# Patient Record
Sex: Male | Born: 1960 | Race: White | Hispanic: No | Marital: Married | Smoking: Former smoker
Health system: Southern US, Community
[De-identification: ages and names within clinical notes are randomized; demographics above are authoritative.]

## PROBLEM LIST (undated history)

## (undated) DIAGNOSIS — G4733 Obstructive sleep apnea (adult) (pediatric): Secondary | ICD-10-CM

## (undated) DIAGNOSIS — E669 Obesity, unspecified: Secondary | ICD-10-CM

## (undated) DIAGNOSIS — F319 Bipolar disorder, unspecified: Secondary | ICD-10-CM

## (undated) DIAGNOSIS — T7840XA Allergy, unspecified, initial encounter: Secondary | ICD-10-CM

## (undated) DIAGNOSIS — I2510 Atherosclerotic heart disease of native coronary artery without angina pectoris: Secondary | ICD-10-CM

## (undated) HISTORY — DX: Atherosclerotic heart disease of native coronary artery without angina pectoris: I25.10

## (undated) HISTORY — DX: Allergy, unspecified, initial encounter: T78.40XA

## (undated) HISTORY — DX: Obesity, unspecified: E66.9

## (undated) HISTORY — DX: Obstructive sleep apnea (adult) (pediatric): G47.33

## (undated) HISTORY — DX: Bipolar disorder, unspecified: F31.9

---

## 2013-01-11 ENCOUNTER — Encounter: Payer: Self-pay | Admitting: *Deleted

## 2013-01-11 ENCOUNTER — Encounter: Payer: Self-pay | Admitting: Cardiology

## 2013-01-11 DIAGNOSIS — G4733 Obstructive sleep apnea (adult) (pediatric): Secondary | ICD-10-CM | POA: Insufficient documentation

## 2013-01-11 DIAGNOSIS — F319 Bipolar disorder, unspecified: Secondary | ICD-10-CM | POA: Insufficient documentation

## 2013-01-11 DIAGNOSIS — E669 Obesity, unspecified: Secondary | ICD-10-CM | POA: Insufficient documentation

## 2013-01-16 ENCOUNTER — Encounter: Payer: Self-pay | Admitting: Cardiology

## 2013-01-20 ENCOUNTER — Ambulatory Visit (INDEPENDENT_AMBULATORY_CARE_PROVIDER_SITE_OTHER): Payer: 59 | Admitting: Cardiology

## 2013-01-20 ENCOUNTER — Encounter: Payer: Self-pay | Admitting: Cardiology

## 2013-01-20 VITALS — BP 150/80 | HR 80 | Ht 69.0 in | Wt 282.0 lb

## 2013-01-20 DIAGNOSIS — G4733 Obstructive sleep apnea (adult) (pediatric): Secondary | ICD-10-CM

## 2013-01-20 DIAGNOSIS — E669 Obesity, unspecified: Secondary | ICD-10-CM

## 2013-01-20 NOTE — Progress Notes (Signed)
  [address] [place], [place]  [address] Phone: [phone] Fax:  [phone]  Date:  01/20/2013   ID:  Luke [name], DOB 22-May-1960, MRN [id number]  PCP:  No primary provider on file.  Sleep MD:  Armanda Magic, MD     HPI:  THe patient presents today for followup of his OSA and obesity.  He is doing well.  He tolerates the nasal pillow mask well with no chin strap.  He feels the pressure is adequate.  He feels rested in the am and has no daytime sleepiness.  He walks 2 miles every day.     Past Medical History  Diagnosis Date  . Bipolar disorder   . OSA (obstructive sleep apnea)     mild OSA with AHI 15.2/hr on CPAP at 12cm H2O - Dr. Mayford Knife  . Obesity     Current Outpatient Prescriptions  Medication Sig Dispense Refill  . Cholecalciferol (VITAMIN D-3) 1000 UNITS CAPS Take by mouth.      . fenofibrate 160 MG tablet Take 160 mg by mouth daily.      Marland Kitchen lamoTRIgine (LAMICTAL) 150 MG tablet 2 tabs po qd      . Multiple Vitamin (MULTIVITAMIN) capsule Take 1 capsule by mouth daily.      . Omega-3 Fatty Acids (FISH OIL PO) Take 1 tablet by mouth daily.       No current facility-administered medications for this visit.    Allergies:    Allergies  Allergen Reactions  . Penicillins     Social History:  The patient  reports that he quit smoking about 8 years ago. His smoking use included Cigarettes. He smoked 0.00 packs per day. He does not have any smokeless tobacco history on file.   Family History:  The patient's family history includes CVA in his father and mother; Diabetes Mellitus II in his sister.   ROS:  Please see the history of present illness.      All other systems reviewed and negative.   PHYSICAL EXAM: VS:  BP 150/80  Pulse 80  Ht 5\' 9"  (1.753 m)  Wt 282 lb (127.914 kg)  BMI 41.63 kg/m2 Well nourished, well developed, in no acute distress HEENT: normal Neck: no JVD Cardiac:  normal S1, S2; RRR; no murmur Lungs:  clear to auscultation bilaterally,  no wheezing, rhonchi or rales Abd: soft, nontender, no hepatomegaly Ext: no edema Skin: warm and dry Neuro:  CNs 2-12 intact, no focal abnormalities noted      ASSESSMENT AND PLAN:  1. Obstructive sleep apnea - well controlled with no daytime sleepiness.  I will get a download from his DME. 2. Obesity - I have encouraged him to increase his walking to 1 hour of brisk walking daily.  We discussed portion control with his diet as well.  Followup with me in 6 months  Signed, Armanda Magic, MD 01/20/2013 9:11 AM

## 2013-01-20 NOTE — Patient Instructions (Signed)
Your physician recommends that you continue on your current medications as directed. Please refer to the Current Medication list given to you today.  Your physician recommends that you schedule a follow-up appointment in: 6 MONTHS with Dr. Mayford Knife

## 2013-02-11 NOTE — Progress Notes (Signed)
To Danielle.  

## 2013-02-11 NOTE — Progress Notes (Signed)
Pt is aware of the results

## 2013-02-19 ENCOUNTER — Ambulatory Visit: Payer: 59 | Admitting: Emergency Medicine

## 2013-02-19 VITALS — BP 138/60 | HR 86 | Temp 97.9°F | Resp 18 | Ht 70.5 in | Wt 281.4 lb

## 2013-02-19 DIAGNOSIS — Z0000 Encounter for general adult medical examination without abnormal findings: Secondary | ICD-10-CM

## 2013-02-19 DIAGNOSIS — Z0289 Encounter for other administrative examinations: Secondary | ICD-10-CM

## 2013-02-19 NOTE — Progress Notes (Signed)
[facility] [address], [place] [address] [phone]  Date:  02/19/2013   Name:  Luke Luke [name]   DOB:  1961-01-25   MRN:  [id number]  PCP:  No primary provider on file.    Chief Complaint: Annual Exam   History of Present Illness:  Luke Luke [name] is a 52 y.o. very pleasant male patient who presents with the following:  For DOT certification   Patient Active Problem List   Diagnosis Date Noted  . Bipolar disorder   . OSA (obstructive sleep apnea)   . Obesity     Past Medical History  Diagnosis Date  . Bipolar disorder   . OSA (obstructive sleep apnea)     mild OSA with AHI 15.2/hr on CPAP at 12cm H2O - Dr. Mayford Knife  . Obesity   . Allergy     No past surgical history on file.  History  Substance Use Topics  . Smoking status: Former Smoker    Types: Cigarettes    Quit date: 12/16/2004  . Smokeless tobacco: Not on file  . Alcohol Use: Not on file    Family History  Problem Relation Age of Onset  . CVA Mother   . CVA Father   . Diabetes Mellitus Luke [name] Sister     Allergies  Allergen Reactions  . Penicillins     Medication list has been reviewed and updated.  Current Outpatient Prescriptions on File Prior to Visit  Medication Sig Dispense Refill  . Cholecalciferol (VITAMIN D-3) 1000 UNITS CAPS Take by mouth.      . fenofibrate 160 MG tablet Take 160 mg by mouth daily.      Marland Kitchen lamoTRIgine (LAMICTAL) 150 MG tablet 2 tabs po qd      . Multiple Vitamin (MULTIVITAMIN) capsule Take 1 capsule by mouth daily.      . Omega-3 Fatty Acids (FISH OIL PO) Take 1 tablet by mouth daily.       No current facility-administered medications on file prior to visit.    Review of Systems:  As per HPI, otherwise negative.    Physical Examination: Filed Vitals:   02/19/13 1423  BP: 156/98  Pulse: 86  Temp: 97.9 F (36.6 C)  Resp: 18   Filed Vitals:   02/19/13 1423  Height: 5' 10.5" (1.791 m)  Weight: 281 lb 6.4 oz (127.642 kg)    Body mass index is 39.79 kg/(m^2). Ideal Body Weight: Weight in (lb) to have BMI = 25: 176.4  GEN: WDWN, NAD, Non-toxic, A & O x 3 HEENT: Atraumatic, Normocephalic. Neck supple. No masses, No LAD. Ears and Nose: No external deformity. CV: RRR, No M/G/R. No JVD. No thrill. No extra heart sounds. PULM: CTA B, no wheezes, crackles, rhonchi. No retractions. No resp. distress. No accessory muscle use. ABD: S, NT, ND, +BS. No rebound. No HSM. EXTR: No c/c/e NEURO Normal gait.  PSYCH: Normally interactive. Conversant. Not depressed or anxious appearing.  Calm demeanor.    Assessment and Plan: DOT certification   Blood pressure good Need OSA / CPAP record   Signed,  Phillips Odor, MD

## 2013-02-21 ENCOUNTER — Telehealth: Payer: Self-pay | Admitting: Cardiology

## 2013-02-21 NOTE — Telephone Encounter (Signed)
New Problem:  Pt states he needs a letter stating his sleep study results are ok and that he is using his c-pap machine appropriately.

## 2013-02-21 NOTE — Telephone Encounter (Signed)
To Dr. Mayford Knife to ok me sending pt this letter stating his Sleep Study results and that he is doing well on the CPAP machine

## 2013-02-23 NOTE — Telephone Encounter (Signed)
Patient is doing well and is very compliant with his CPAP device

## 2013-02-24 ENCOUNTER — Encounter: Payer: Self-pay | Admitting: General Surgery

## 2013-02-24 NOTE — Telephone Encounter (Signed)
Letter wrote for pt and signed by Dr. Mayford Knife. Put up front for pt to pick up./

## 2013-04-03 ENCOUNTER — Ambulatory Visit (INDEPENDENT_AMBULATORY_CARE_PROVIDER_SITE_OTHER): Payer: 59 | Admitting: Family Medicine

## 2013-04-03 VITALS — BP 142/80 | HR 91 | Temp 98.1°F | Resp 18 | Ht 70.0 in | Wt 274.0 lb

## 2013-04-03 DIAGNOSIS — T50905A Adverse effect of unspecified drugs, medicaments and biological substances, initial encounter: Secondary | ICD-10-CM

## 2013-04-03 DIAGNOSIS — T887XXA Unspecified adverse effect of drug or medicament, initial encounter: Secondary | ICD-10-CM

## 2013-04-03 DIAGNOSIS — L500 Allergic urticaria: Secondary | ICD-10-CM

## 2013-04-03 MED ORDER — RANITIDINE HCL 150 MG PO TABS: 150.0000 mg | ORAL_TABLET | Freq: Two times a day (BID) | ORAL | Status: DC

## 2013-04-03 MED ORDER — PREDNISONE 20 MG PO TABS: 40.0000 mg | ORAL_TABLET | Freq: Every day | ORAL | Status: DC

## 2013-04-03 MED ORDER — LAMOTRIGINE 150 MG PO TABS: 300.0000 mg | ORAL_TABLET | Freq: Every day | ORAL | Status: AC

## 2013-04-03 MED ORDER — CETIRIZINE HCL 10 MG PO TABS: 10.0000 mg | ORAL_TABLET | Freq: Every day | ORAL | Status: DC

## 2013-04-03 NOTE — Patient Instructions (Signed)

## 2013-04-03 NOTE — Progress Notes (Signed)
Subjective:    Patient ID: Luke [name], male    DOB: 04/23/1960, 52 y.o.   MRN: [id number] This chart was scribed for Norberto Sorenson, MD by Danella Maiers, ED Scribe. This patient was seen in room 3 and the patient's care was started at 9:16 AM.  Chief Complaint  Patient presents with  . Urticaria    arms -started monday     HPI HPI Comments: Luke [name] is a 53 y.o. male who presents to the [facility] complaining of itchy rash over entire body but worst to bilateral arms onset 2 days ago that started around his belt line immed after a change in the manufacturer of his Lamictal. Yesterday it spread to the back of both legs and buttocks. This morning it spread to his sides and just one hour ago spread to bilateral arms. He states 2 days ago he started taking his Lamicatal from a different manufacturer - [employer] - because of changes in insurance which required his med be supplied through a mail-order pharmacy rather than [facility] as usual. He was on the generic from [facility] as well but definitely not manufactured by [employer] - he checked as well as the pill looks different. He has been taking 1-2 benadryl every 4 hours to help w/ the itching but only works minimally - still very very itchy. He denies new clothes, jewelry, food. Has not been eating any sig source of fruit, no hiking or outdoor work recently. No change in soaps, lotions, detergent, etc. No change in other meds, no new or different vitamins/herbals/supplements. His mood is very stable - no prob at all w/ it. He has been on generic lamictal through [facility] for yrs w/o issue.  PCP Lupe Carney, MD  Past Medical History  Diagnosis Date  . Bipolar disorder   . OSA (obstructive sleep apnea)     mild OSA with AHI 15.2/hr on CPAP at 12cm H2O - Dr. Mayford Knife  . Obesity   . Allergy     Current Outpatient Prescriptions on File Prior to Visit  Medication Sig Dispense Refill  . Cholecalciferol (VITAMIN D-3) 1000 UNITS CAPS  Take by mouth.      . fenofibrate 160 MG tablet Take 160 mg by mouth daily.      Marland Kitchen lamoTRIgine (LAMICTAL) 150 MG tablet 2 tabs po qd      . Multiple Vitamin (MULTIVITAMIN) capsule Take 1 capsule by mouth daily.      . Omega-3 Fatty Acids (FISH OIL PO) Take 1 tablet by mouth daily.       No current facility-administered medications on file prior to visit.   Allergies  Allergen Reactions  . Penicillins     Review of Systems  Constitutional: Negative for fever, chills, activity change and appetite change.  HENT: Negative for trouble swallowing.   Respiratory: Negative for cough, chest tightness, shortness of breath and wheezing.   Cardiovascular: Negative for chest pain, palpitations and leg swelling.  Gastrointestinal: Negative for nausea, vomiting, abdominal pain, diarrhea and constipation.  Musculoskeletal: Negative for gait problem, joint swelling and myalgias.  Skin: Positive for rash. Negative for wound.  Neurological: Negative for weakness and numbness.  Hematological: Negative for adenopathy. Does not bruise/bleed easily.  Psychiatric/Behavioral: Positive for sleep disturbance. Negative for behavioral problems, dysphoric mood and agitation. The patient is not nervous/anxious.       BP 142/80  Pulse 91  Temp(Src) 98.1 F (36.7 C) (Oral)  Resp 18  Ht 5'  10" (1.778 m)  Wt 274 lb (124.286 kg)  BMI 39.32 kg/m2  SpO2 96% Objective:   Physical Exam  Nursing note and vitals reviewed. Constitutional: He is oriented to person, place, and time. He appears well-developed and well-nourished. No distress.  HENT:  Head: Normocephalic and atraumatic.  Eyes: EOM are normal.  Neck: Neck supple. No tracheal deviation present.  Cardiovascular: Normal rate and regular rhythm.   Pulmonary/Chest: Effort normal and breath sounds normal. No respiratory distress. He has no wheezes.  Musculoskeletal: Normal range of motion.  Neurological: He is alert and oriented to person, place, and time.    Skin: Skin is warm and dry. Rash noted. Rash is urticarial.  Urticaria over proximal arms, prox legs, chest, abd, back -  worst over bilateral inner forearm. Also moderately present over entire ant and post trunk - worst laterally.  Psychiatric: He has a normal mood and affect. His behavior is normal.          Assessment & Plan:  Allergic urticaria - I think pt's theory that he is having an allergic rash to an inactive ingredient in his new generic lamictal seems likely. Certainly  lamictal can cause some horrible rashes (TEN, Steven-Johnson, etc) but this seems highly unlikely due to blatant urticarial nature of rash (transient appearance and location) and the fact that pt has been on a stable dose for a long time and he is otherwise asymptomatic. He cannot think any anything else that is new or different in his life that could be causing an allergy.  Will try switching the lamictal back to either the generic he was on prior or brand name. In short term, try prednisone 40 qd x 3d to help w/ severe itching - warned of mood side effects but pt not concerned - mood very stable. Start zyrtec/zantac qd x 2 wks. RTC if continues or worsens.  Medication reaction, initial encounter  Meds ordered this encounter  Medications  . lamoTRIgine (LAMICTAL) 150 MG tablet    Sig: Take 2 tablets (300 mg total) by mouth daily. Please only use generic from manufacturer pt has been on prior OR brand name. Do NOT USE GENERIC FROM [employer] - MEDICALLY NECESSARY    Dispense:  60 tablet    Refill:  1    Please only use generic from manufacturer pt has been on prior OR brand name. Do NOT USE GENERIC FROM [employer] - MEDICALLY NECESSARY  . predniSONE (DELTASONE) 20 MG tablet    Sig: Take 2 tablets (40 mg total) by mouth daily with breakfast.    Dispense:  6 tablet    Refill:  0  . cetirizine (ZYRTEC) 10 MG tablet    Sig: Take 1 tablet (10 mg total) by mouth daily.    Dispense:  30 tablet    Refill:  1  . ranitidine  (ZANTAC) 150 MG tablet    Sig: Take 1 tablet (150 mg total) by mouth 2 (two) times daily.    Dispense:  60 tablet    Refill:  0    I personally performed the services described in this documentation, which was scribed in my presence. The recorded information has been reviewed and considered, and addended by me as needed.  Norberto Sorenson, MD MPH

## 2013-04-07 ENCOUNTER — Ambulatory Visit (INDEPENDENT_AMBULATORY_CARE_PROVIDER_SITE_OTHER): Payer: 59 | Admitting: Emergency Medicine

## 2013-04-07 VITALS — BP 144/74 | HR 100 | Temp 100.4°F | Resp 18 | Ht 70.5 in | Wt 276.0 lb

## 2013-04-07 DIAGNOSIS — R509 Fever, unspecified: Secondary | ICD-10-CM

## 2013-04-07 DIAGNOSIS — J111 Influenza due to unidentified influenza virus with other respiratory manifestations: Secondary | ICD-10-CM

## 2013-04-07 LAB — POCT INFLUENZA A/B: Influenza A, POC: POSITIVE

## 2013-04-07 MED ORDER — PROMETHAZINE-CODEINE 6.25-10 MG/5ML PO SYRP: 5.0000 mL | ORAL_SOLUTION | ORAL | Status: DC | PRN

## 2013-04-07 MED ORDER — OSELTAMIVIR PHOSPHATE 75 MG PO CAPS: 75.0000 mg | ORAL_CAPSULE | Freq: Two times a day (BID) | ORAL | Status: DC

## 2013-04-07 NOTE — Patient Instructions (Signed)

## 2013-04-07 NOTE — Progress Notes (Signed)
[facility] [address], [place] [address] [phone]  Date:  04/07/2013   Name:  Luke [name]   DOB:  [birth year]   MRN:  [id number]  PCP:  Lupe Carney, MD    Chief Complaint: Chest Congestion and Cough   History of Present Illness:  Luke [name] is a 51 y.o. very pleasant male patient who presents with the following:  Sudden onset illness yesterday with fever, chills, malaise, myalgia, cough and nasal congestion.  No wheezing or shortness of breath.  No nausea or vomiting. No ill contacts.  No improvement with over the counter medications or other home remedies. Denies other complaint or health concern today.   Patient Active Problem List   Diagnosis Date Noted  . Bipolar disorder   . OSA (obstructive sleep apnea)   . Obesity     Past Medical History  Diagnosis Date  . Bipolar disorder   . OSA (obstructive sleep apnea)     mild OSA with AHI 15.2/hr on CPAP at 12cm H2O - Dr. Mayford Knife  . Obesity   . Allergy     History reviewed. No pertinent past surgical history.  History  Substance Use Topics  . Smoking status: Former Smoker    Types: Cigarettes    Quit date: 12/16/2004  . Smokeless tobacco: Not on file  . Alcohol Use: Not on file    Family History  Problem Relation Age of Onset  . CVA Mother   . CVA Father   . Diabetes Mellitus [name] Sister     Allergies  Allergen Reactions  . Penicillins     Medication list has been reviewed and updated.  Current Outpatient Prescriptions on File Prior to Visit  Medication Sig Dispense Refill  . Cholecalciferol (VITAMIN D-3) 1000 UNITS CAPS Take by mouth.      . fenofibrate 160 MG tablet Take 160 mg by mouth daily.      Marland Kitchen lamoTRIgine (LAMICTAL) 150 MG tablet Take 2 tablets (300 mg total) by mouth daily. Please only use generic from manufacturer pt has been on prior OR brand name. Do NOT USE GENERIC FROM [employer] - MEDICALLY NECESSARY  60 tablet  1  . Multiple Vitamin (MULTIVITAMIN)  capsule Take 1 capsule by mouth daily.      . Omega-3 Fatty Acids (FISH OIL PO) Take 1 tablet by mouth daily.      . cetirizine (ZYRTEC) 10 MG tablet Take 1 tablet (10 mg total) by mouth daily.  30 tablet  1  . predniSONE (DELTASONE) 20 MG tablet Take 2 tablets (40 mg total) by mouth daily with breakfast.  6 tablet  0  . ranitidine (ZANTAC) 150 MG tablet Take 1 tablet (150 mg total) by mouth 2 (two) times daily.  60 tablet  0   No current facility-administered medications on file prior to visit.    Review of Systems:  As per HPI, otherwise negative.    Physical Examination: Filed Vitals:   04/07/13 1356  BP: 144/74  Pulse: 100  Temp: 100.4 F (38 C)  Resp: 18   Filed Vitals:   04/07/13 1356  Height: 5' 10.5" (1.791 m)  Weight: 276 lb (125.193 kg)   Body mass index is 39.03 kg/(m^2). Ideal Body Weight: Weight in (lb) to have BMI = 25: 176.4  GEN: WDWN, NAD, Non-toxic, A & O x 3 HEENT: Atraumatic, Normocephalic. Neck supple. No masses, No LAD. Ears and Nose: No external deformity. CV: RRR, No M/G/R.  No JVD. No thrill. No extra heart sounds. PULM: CTA B, diffuse wheezes, no crackles, rhonchi. No retractions. No resp. distress. No accessory muscle use. ABD: S, NT, ND, +BS. No rebound. No HSM. EXTR: No c/c/e NEURO Normal gait.  PSYCH: Normally interactive. Conversant. Not depressed or anxious appearing.  Calm demeanor.    Assessment and Plan:  Influenza tamiflu Phen c cod   Signed,  Phillips Odor, MD

## 2015-03-19 ENCOUNTER — Ambulatory Visit (INDEPENDENT_AMBULATORY_CARE_PROVIDER_SITE_OTHER): Payer: Self-pay | Admitting: Family Medicine

## 2015-03-19 VITALS — BP 140/84 | HR 74 | Temp 98.4°F | Resp 16 | Ht 72.0 in | Wt 278.8 lb

## 2015-03-19 DIAGNOSIS — M62830 Muscle spasm of back: Secondary | ICD-10-CM

## 2015-03-19 DIAGNOSIS — S39012A Strain of muscle, fascia and tendon of lower back, initial encounter: Secondary | ICD-10-CM

## 2015-03-19 MED ORDER — HYDROCODONE-ACETAMINOPHEN 5-325 MG PO TABS: 1.0000 | ORAL_TABLET | Freq: Four times a day (QID) | ORAL | Status: DC | PRN

## 2015-03-19 MED ORDER — NAPROXEN 500 MG PO TABS: 500.0000 mg | ORAL_TABLET | Freq: Two times a day (BID) | ORAL | Status: DC

## 2015-03-19 MED ORDER — CYCLOBENZAPRINE HCL 10 MG PO TABS: 10.0000 mg | ORAL_TABLET | Freq: Three times a day (TID) | ORAL | Status: DC | PRN

## 2015-03-19 NOTE — Patient Instructions (Signed)
Take the cyclobenzaprine muscle relaxant one pill 3 times daily. Will tend to make you drowsy.  Take the naproxen 500 mg one twice daily for pain and inflammation  More severe pain take the hydrocodone pain pills one every 6 hours. Be cautious not to abuse these. They also will cause drowsiness, and her best taken at night.  Gentle back stretches  Return as needed  Work hard on trying to lose pounds in the coming months and years.  Back Exercises The following exercises strengthen the muscles that help to support the back. They also help to keep the lower back flexible. Doing these exercises can help to prevent back pain or lessen existing pain. If you have back pain or discomfort, try doing these exercises 2-3 times each day or as told by your health care provider. When the pain goes away, do them once each day, but increase the number of times that you repeat the steps for each exercise (do more repetitions). If you do not have back pain or discomfort, do these exercises once each day or as told by your health care provider. EXERCISES Single Knee to Chest Repeat these steps 3-5 times for each leg: 1. Lie on your back on a firm bed or the floor with your legs extended. 2. Bring one knee to your chest. Your other leg should stay extended and in contact with the floor. 3. Hold your knee in place by grabbing your knee or thigh. 4. Pull on your knee until you feel a gentle stretch in your lower back. 5. Hold the stretch for 10-30 seconds. 6. Slowly release and straighten your leg. Pelvic Tilt Repeat these steps 5-10 times: 1. Lie on your back on a firm bed or the floor with your legs extended. 2. Bend your knees so they are pointing toward the ceiling and your feet are flat on the floor. 3. Tighten your lower abdominal muscles to press your lower back against the floor. This motion will tilt your pelvis so your tailbone points up toward the ceiling instead of pointing to your feet or the  floor. 4. With gentle tension and even breathing, hold this position for 5-10 seconds. Cat-Cow Repeat these steps until your lower back becomes more flexible: 1. Get into a hands-and-knees position on a firm surface. Keep your hands under your shoulders, and keep your knees under your hips. You may place padding under your knees for comfort. 2. Let your head hang down, and point your tailbone toward the floor so your lower back becomes rounded like the back of a cat. 3. Hold this position for 5 seconds. 4. Slowly lift your head and point your tailbone up toward the ceiling so your back forms a sagging arch like the back of a cow. 5. Hold this position for 5 seconds. Press-Ups Repeat these steps 5-10 times: 1. Lie on your abdomen (face-down) on the floor. 2. Place your palms near your head, about shoulder-width apart. 3. While you keep your back as relaxed as possible and keep your hips on the floor, slowly straighten your arms to raise the top half of your body and lift your shoulders. Do not use your back muscles to raise your upper torso. You may adjust the placement of your hands to make yourself more comfortable. 4. Hold this position for 5 seconds while you keep your back relaxed. 5. Slowly return to lying flat on the floor. Bridges Repeat these steps 10 times: 1. Lie on your back on a firm surface.  2. Bend your knees so they are pointing toward the ceiling and your feet are flat on the floor. 3. Tighten your buttocks muscles and lift your buttocks off of the floor until your waist is at almost the same height as your knees. You should feel the muscles working in your buttocks and the back of your thighs. If you do not feel these muscles, slide your feet 1-2 inches farther away from your buttocks. 4. Hold this position for 3-5 seconds. 5. Slowly lower your hips to the starting position, and allow your buttocks muscles to relax completely. If this exercise is too easy, try doing it with  your arms crossed over your chest. Abdominal Crunches Repeat these steps 5-10 times: 1. Lie on your back on a firm bed or the floor with your legs extended. 2. Bend your knees so they are pointing toward the ceiling and your feet are flat on the floor. 3. Cross your arms over your chest. 4. Tip your chin slightly toward your chest without bending your neck. 5. Tighten your abdominal muscles and slowly raise your trunk (torso) high enough to lift your shoulder blades a tiny bit off of the floor. Avoid raising your torso higher than that, because it can put too much stress on your low back and it does not help to strengthen your abdominal muscles. 6. Slowly return to your starting position. Back Lifts Repeat these steps 5-10 times: 1. Lie on your abdomen (face-down) with your arms at your sides, and rest your forehead on the floor. 2. Tighten the muscles in your legs and your buttocks. 3. Slowly lift your chest off of the floor while you keep your hips pressed to the floor. Keep the back of your head in line with the curve in your back. Your eyes should be looking at the floor. 4. Hold this position for 3-5 seconds. 5. Slowly return to your starting position. SEEK MEDICAL CARE IF:  Your back pain or discomfort gets much worse when you do an exercise.  Your back pain or discomfort does not lessen within 2 hours after you exercise. If you have any of these problems, stop doing these exercises right away. Do not do them again unless your health care provider says that you can. SEEK IMMEDIATE MEDICAL CARE IF:  You develop sudden, severe back pain. If this happens, stop doing the exercises right away. Do not do them again unless your health care provider says that you can.   This information is not intended to replace advice given to you by your health care provider. Make sure you discuss any questions you have with your health care provider.   Document Released: 05/11/2004 Document Revised:  12/23/2014 Document Reviewed: 05/28/2014 Elsevier Interactive Patient Education [employer]2016 [employer]

## 2015-03-19 NOTE — Progress Notes (Signed)
Patient ID: Luke [name], male    DOB: 10/23/1960  Age: 54 y.o. MRN: [id number]  Chief Complaint  Patient presents with  . lower back spasms    right side, started 03/18/15 am    Subjective:   54 year old man who started having back spasms yesterday.No specific injury. It just tightened up terribly in the right side of his back. It is not constant the, though he feels a constant tightness and pain there intermittently it is spasming up terribly. He has had some milder problems with his back in the past with previous job years ago. He has had a feeling of weakness in his right leg with spasms up, but no real radicular pain.  His weight is staying fairly constant over last few years, though he has lost about 5 pounds recently. He knows the need to work on that.  Current allergies, medications, problem list, past/family and social histories reviewed.  Objective:  BP 140/84 mmHg  Pulse 74  Temp(Src) 98.4 F (36.9 C) (Oral)  Resp 16  Ht 6' (1.829 m)  Wt 278 lb 12.8 oz (126.463 kg)  BMI 37.80 kg/m2  SpO2 92%  Ovoid gentleman in some discomfort and significant pain when he tries to move around. Nontender along the spine. Left side of the low back is normal. Right side is really not significantly tender just to palpation or gentle percussion. However on anterior flexion there is a great deal of pain when he just does very limited flexion. Extension and side-to-side tilt do not seem to cause Much trouble. Straight leg raising seated causes pain in the right side of the back, as well as contralateral pain with lifting the left leg. It is not as much a radicular pain as a strain spasm pain in the back.  Assessment & Plan:   Assessment: 1. Back spasm   2. Back strain, initial encounter       Plan: Will treat with muscle relaxant and pain medication and some gentle stretches. He can use heat or ice. Return as necessary. Stay off work today.  Meds ordered this encounter  Medications    . cyclobenzaprine (FLEXERIL) 10 MG tablet    Sig: Take 1 tablet (10 mg total) by mouth 3 (three) times daily as needed for muscle spasms.    Dispense:  30 tablet    Refill:  0  . naproxen (NAPROSYN) 500 MG tablet    Sig: Take 1 tablet (500 mg total) by mouth 2 (two) times daily with a meal.    Dispense:  30 tablet    Refill:  0  . HYDROcodone-acetaminophen (NORCO) 5-325 MG tablet    Sig: Take 1 tablet by mouth every 6 (six) hours as needed.    Dispense:  20 tablet    Refill:  0         Patient Instructions  Take the cyclobenzaprine muscle relaxant one pill 3 times daily. Will tend to make you drowsy.  Take the naproxen 500 mg one twice daily for pain and inflammation  More severe pain take the hydrocodone pain pills one every 6 hours. Be cautious not to abuse these. They also will cause drowsiness, and her best taken at night.  Gentle back stretches  Return as needed  Work hard on trying to lose pounds in the coming months and years.  Back Exercises The following exercises strengthen the muscles that help to support the back. They also help to keep the lower back flexible. Doing these exercises can  help to prevent back pain or lessen existing pain. If you have back pain or discomfort, try doing these exercises 2-3 times each day or as told by your health care provider. When the pain goes away, do them once each day, but increase the number of times that you repeat the steps for each exercise (do more repetitions). If you do not have back pain or discomfort, do these exercises once each day or as told by your health care provider. EXERCISES Single Knee to Chest Repeat these steps 3-5 times for each leg: 1. Lie on your back on a firm bed or the floor with your legs extended. 2. Bring one knee to your chest. Your other leg should stay extended and in contact with the floor. 3. Hold your knee in place by grabbing your knee or thigh. 4. Pull on your knee until you feel a gentle  stretch in your lower back. 5. Hold the stretch for 10-30 seconds. 6. Slowly release and straighten your leg. Pelvic Tilt Repeat these steps 5-10 times: 1. Lie on your back on a firm bed or the floor with your legs extended. 2. Bend your knees so they are pointing toward the ceiling and your feet are flat on the floor. 3. Tighten your lower abdominal muscles to press your lower back against the floor. This motion will tilt your pelvis so your tailbone points up toward the ceiling instead of pointing to your feet or the floor. 4. With gentle tension and even breathing, hold this position for 5-10 seconds. Cat-Cow Repeat these steps until your lower back becomes more flexible: 1. Get into a hands-and-knees position on a firm surface. Keep your hands under your shoulders, and keep your knees under your hips. You may place padding under your knees for comfort. 2. Let your head hang down, and point your tailbone toward the floor so your lower back becomes rounded like the back of a cat. 3. Hold this position for 5 seconds. 4. Slowly lift your head and point your tailbone up toward the ceiling so your back forms a sagging arch like the back of a cow. 5. Hold this position for 5 seconds. Press-Ups Repeat these steps 5-10 times: 1. Lie on your abdomen (face-down) on the floor. 2. Place your palms near your head, about shoulder-width apart. 3. While you keep your back as relaxed as possible and keep your hips on the floor, slowly straighten your arms to raise the top half of your body and lift your shoulders. Do not use your back muscles to raise your upper torso. You may adjust the placement of your hands to make yourself more comfortable. 4. Hold this position for 5 seconds while you keep your back relaxed. 5. Slowly return to lying flat on the floor. Bridges Repeat these steps 10 times: 1. Lie on your back on a firm surface. 2. Bend your knees so they are pointing toward the ceiling and your feet  are flat on the floor. 3. Tighten your buttocks muscles and lift your buttocks off of the floor until your waist is at almost the same height as your knees. You should feel the muscles working in your buttocks and the back of your thighs. If you do not feel these muscles, slide your feet 1-2 inches farther away from your buttocks. 4. Hold this position for 3-5 seconds. 5. Slowly lower your hips to the starting position, and allow your buttocks muscles to relax completely. If this exercise is too easy, try doing  it with your arms crossed over your chest. Abdominal Crunches Repeat these steps 5-10 times: 1. Lie on your back on a firm bed or the floor with your legs extended. 2. Bend your knees so they are pointing toward the ceiling and your feet are flat on the floor. 3. Cross your arms over your chest. 4. Tip your chin slightly toward your chest without bending your neck. 5. Tighten your abdominal muscles and slowly raise your trunk (torso) high enough to lift your shoulder blades a tiny bit off of the floor. Avoid raising your torso higher than that, because it can put too much stress on your low back and it does not help to strengthen your abdominal muscles. 6. Slowly return to your starting position. Back Lifts Repeat these steps 5-10 times: 1. Lie on your abdomen (face-down) with your arms at your sides, and rest your forehead on the floor. 2. Tighten the muscles in your legs and your buttocks. 3. Slowly lift your chest off of the floor while you keep your hips pressed to the floor. Keep the back of your head in line with the curve in your back. Your eyes should be looking at the floor. 4. Hold this position for 3-5 seconds. 5. Slowly return to your starting position. SEEK MEDICAL CARE IF:  Your back pain or discomfort gets much worse when you do an exercise.  Your back pain or discomfort does not lessen within 2 hours after you exercise. If you have any of these problems, stop doing  these exercises right away. Do not do them again unless your health care provider says that you can. SEEK IMMEDIATE MEDICAL CARE IF:  You develop sudden, severe back pain. If this happens, stop doing the exercises right away. Do not do them again unless your health care provider says that you can.   This information is not intended to replace advice given to you by your health care provider. Make sure you discuss any questions you have with your health care provider.   Document Released: 05/11/2004 Document Revised: 12/23/2014 Document Reviewed: 05/28/2014 Elsevier Interactive Patient Education [employer]      Return if symptoms worsen or fail to improve.   HOPPER,DAVID, MD 03/19/2015

## 2017-11-20 ENCOUNTER — Emergency Department (HOSPITAL_COMMUNITY): Payer: 59

## 2017-11-20 ENCOUNTER — Encounter (HOSPITAL_COMMUNITY)
Admission: EM | Disposition: A | Discharge status: Home / Self Care | Payer: Self-pay | Source: Home / Self Care | Attending: Internal Medicine

## 2017-11-20 ENCOUNTER — Inpatient Hospital Stay (HOSPITAL_COMMUNITY)
Admission: EM | Admit: 2017-11-20 | Discharge: 2017-11-22 | DRG: 247 | Disposition: A | Discharge status: Home / Self Care | Payer: 59 | Attending: Internal Medicine | Admitting: Internal Medicine

## 2017-11-20 ENCOUNTER — Encounter (HOSPITAL_COMMUNITY): Payer: Self-pay

## 2017-11-20 ENCOUNTER — Other Ambulatory Visit: Payer: Self-pay

## 2017-11-20 DIAGNOSIS — Z720 Tobacco use: Secondary | ICD-10-CM

## 2017-11-20 DIAGNOSIS — Z8042 Family history of malignant neoplasm of prostate: Secondary | ICD-10-CM

## 2017-11-20 DIAGNOSIS — I2510 Atherosclerotic heart disease of native coronary artery without angina pectoris: Secondary | ICD-10-CM | POA: Diagnosis present

## 2017-11-20 DIAGNOSIS — I5030 Unspecified diastolic (congestive) heart failure: Secondary | ICD-10-CM | POA: Diagnosis not present

## 2017-11-20 DIAGNOSIS — Z8249 Family history of ischemic heart disease and other diseases of the circulatory system: Secondary | ICD-10-CM | POA: Diagnosis not present

## 2017-11-20 DIAGNOSIS — Z818 Family history of other mental and behavioral disorders: Secondary | ICD-10-CM

## 2017-11-20 DIAGNOSIS — Z6841 Body Mass Index (BMI) 40.0 and over, adult: Secondary | ICD-10-CM

## 2017-11-20 DIAGNOSIS — I739 Peripheral vascular disease, unspecified: Secondary | ICD-10-CM | POA: Diagnosis present

## 2017-11-20 DIAGNOSIS — Z833 Family history of diabetes mellitus: Secondary | ICD-10-CM

## 2017-11-20 DIAGNOSIS — I214 Non-ST elevation (NSTEMI) myocardial infarction: Secondary | ICD-10-CM | POA: Diagnosis present

## 2017-11-20 DIAGNOSIS — E669 Obesity, unspecified: Secondary | ICD-10-CM | POA: Diagnosis present

## 2017-11-20 DIAGNOSIS — Z823 Family history of stroke: Secondary | ICD-10-CM | POA: Diagnosis not present

## 2017-11-20 DIAGNOSIS — E781 Pure hyperglyceridemia: Secondary | ICD-10-CM | POA: Diagnosis present

## 2017-11-20 DIAGNOSIS — E785 Hyperlipidemia, unspecified: Secondary | ICD-10-CM | POA: Diagnosis present

## 2017-11-20 DIAGNOSIS — Z880 Allergy status to penicillin: Secondary | ICD-10-CM | POA: Diagnosis not present

## 2017-11-20 DIAGNOSIS — I517 Cardiomegaly: Secondary | ICD-10-CM | POA: Diagnosis present

## 2017-11-20 DIAGNOSIS — E782 Mixed hyperlipidemia: Secondary | ICD-10-CM | POA: Diagnosis not present

## 2017-11-20 DIAGNOSIS — F319 Bipolar disorder, unspecified: Secondary | ICD-10-CM | POA: Diagnosis present

## 2017-11-20 DIAGNOSIS — G4733 Obstructive sleep apnea (adult) (pediatric): Secondary | ICD-10-CM | POA: Diagnosis present

## 2017-11-20 DIAGNOSIS — Z955 Presence of coronary angioplasty implant and graft: Secondary | ICD-10-CM | POA: Diagnosis not present

## 2017-11-20 DIAGNOSIS — K219 Gastro-esophageal reflux disease without esophagitis: Secondary | ICD-10-CM | POA: Diagnosis present

## 2017-11-20 HISTORY — PX: LEFT HEART CATH AND CORONARY ANGIOGRAPHY: CATH118249

## 2017-11-20 HISTORY — PX: CORONARY STENT INTERVENTION: CATH118234

## 2017-11-20 LAB — PLATELET COUNT: Platelets: 187 10*3/uL (ref 150–400)

## 2017-11-20 LAB — PROTIME-INR
INR: 0.99
Prothrombin Time: 13 seconds (ref 11.4–15.2)

## 2017-11-20 LAB — BASIC METABOLIC PANEL
Anion gap: 7 (ref 5–15)
BUN: 14 mg/dL (ref 6–20)
CO2: 29 mmol/L (ref 22–32)
Calcium: 9.8 mg/dL (ref 8.9–10.3)
Chloride: 108 mmol/L (ref 98–111)
Creatinine, Ser: 0.99 mg/dL (ref 0.61–1.24)
GFR calc Af Amer: >60 mL/min (ref >60)
GFR calc non Af Amer: >60 mL/min (ref >60)
Glucose, Bld: 163 mg/dL — ABNORMAL HIGH (ref 70–99)
Potassium: 4.4 mmol/L (ref 3.5–5.1)
Sodium: 144 mmol/L (ref 135–145)

## 2017-11-20 LAB — APTT: aPTT: 29 seconds (ref 24–36)

## 2017-11-20 LAB — MRSA PCR SCREENING: MRSA BY PCR: NEGATIVE

## 2017-11-20 LAB — CBC
HCT: 43.9 % (ref 39.0–52.0)
Hemoglobin: 14.8 g/dL (ref 13.0–17.0)
MCH: 33.3 pg (ref 26.0–34.0)
MCHC: 33.7 g/dL (ref 30.0–36.0)
MCV: 98.7 fL (ref 78.0–100.0)
Platelets: 205 10*3/uL (ref 150–400)
RBC: 4.45 MIL/uL (ref 4.22–5.81)
RDW: 13 % (ref 11.5–15.5)
WBC: 6.2 10*3/uL (ref 4.0–10.5)

## 2017-11-20 LAB — I-STAT TROPONIN, ED: Troponin i, poc: 0.66 ng/mL (ref 0.00–0.08)

## 2017-11-20 LAB — TROPONIN I
TROPONIN I: 10.58 ng/mL — AB (ref <0.03)
TROPONIN I: 16.63 ng/mL — AB (ref <0.03)

## 2017-11-20 SURGERY — LEFT HEART CATH AND CORONARY ANGIOGRAPHY
Anesthesia: LOCAL

## 2017-11-20 MED ORDER — FUROSEMIDE 10 MG/ML IJ SOLN
INTRAMUSCULAR | Status: DC | PRN
  Administered 2017-11-20: 40 mg via INTRAVENOUS

## 2017-11-20 MED ORDER — ASPIRIN 81 MG PO CHEW
81.0000 mg | CHEWABLE_TABLET | Freq: Every day | ORAL | Status: DC
  Administered 2017-11-21 – 2017-11-22 (×2): 81 mg via ORAL
  Filled 2017-11-20 (×2): qty 1

## 2017-11-20 MED ORDER — NITROGLYCERIN IN D5W 200-5 MCG/ML-% IV SOLN
10.0000 ug/min | INTRAVENOUS | Status: DC
  Administered 2017-11-20: 10 ug/min via INTRAVENOUS
  Filled 2017-11-20: qty 250

## 2017-11-20 MED ORDER — NITROGLYCERIN 0.4 MG SL SUBL
0.4000 mg | SUBLINGUAL_TABLET | SUBLINGUAL | Status: DC | PRN
  Administered 2017-11-20: 0.4 mg via SUBLINGUAL
  Filled 2017-11-20: qty 1

## 2017-11-20 MED ORDER — ATORVASTATIN CALCIUM 80 MG PO TABS
80.0000 mg | ORAL_TABLET | Freq: Every day | ORAL | Status: DC
  Administered 2017-11-20 – 2017-11-21 (×2): 80 mg via ORAL
  Filled 2017-11-20 (×2): qty 1

## 2017-11-20 MED ORDER — SODIUM CHLORIDE 0.9 % WEIGHT BASED INFUSION: 1.0000 mL/kg/h | INTRAVENOUS | Status: DC

## 2017-11-20 MED ORDER — HEPARIN SODIUM (PORCINE) 1000 UNIT/ML IJ SOLN
INTRAMUSCULAR | Status: AC
  Filled 2017-11-20: qty 1

## 2017-11-20 MED ORDER — FENTANYL CITRATE (PF) 100 MCG/2ML IJ SOLN
INTRAMUSCULAR | Status: DC | PRN
  Administered 2017-11-20 (×2): 50 ug via INTRAVENOUS

## 2017-11-20 MED ORDER — ADENOSINE (DIAGNOSTIC) FOR INTRACORONARY USE
INTRAVENOUS | Status: DC | PRN
  Administered 2017-11-20 (×3): 30 ug via INTRACORONARY

## 2017-11-20 MED ORDER — HYDRALAZINE HCL 20 MG/ML IJ SOLN: 5.0000 mg | INTRAMUSCULAR | Status: AC | PRN

## 2017-11-20 MED ORDER — HEPARIN SODIUM (PORCINE) 1000 UNIT/ML IJ SOLN
4000.0000 [IU] | Freq: Once | INTRAMUSCULAR | Status: AC
  Administered 2017-11-20: 4000 [IU] via INTRAVENOUS
  Filled 2017-11-20: qty 4

## 2017-11-20 MED ORDER — MIDAZOLAM HCL 2 MG/2ML IJ SOLN
INTRAMUSCULAR | Status: AC
  Filled 2017-11-20: qty 2

## 2017-11-20 MED ORDER — HEPARIN (PORCINE) IN NACL 1000-0.9 UT/500ML-% IV SOLN
INTRAVENOUS | Status: DC | PRN
  Administered 2017-11-20 (×2): 500 mL

## 2017-11-20 MED ORDER — LIDOCAINE HCL (PF) 1 % IJ SOLN
INTRAMUSCULAR | Status: DC | PRN
  Administered 2017-11-20: 2 mL

## 2017-11-20 MED ORDER — SODIUM CHLORIDE 0.9% FLUSH: 3.0000 mL | INTRAVENOUS | Status: DC | PRN

## 2017-11-20 MED ORDER — TICAGRELOR 90 MG PO TABS
90.0000 mg | ORAL_TABLET | Freq: Two times a day (BID) | ORAL | Status: DC
  Administered 2017-11-21 – 2017-11-22 (×4): 90 mg via ORAL
  Filled 2017-11-20 (×4): qty 1

## 2017-11-20 MED ORDER — SODIUM CHLORIDE 0.9 % WEIGHT BASED INFUSION: 3.0000 mL/kg/h | INTRAVENOUS | Status: DC

## 2017-11-20 MED ORDER — TIROFIBAN (AGGRASTAT) BOLUS VIA INFUSION
INTRAVENOUS | Status: DC | PRN
  Administered 2017-11-20: 3107.5 ug via INTRAVENOUS

## 2017-11-20 MED ORDER — FENTANYL CITRATE (PF) 100 MCG/2ML IJ SOLN
INTRAMUSCULAR | Status: AC
  Filled 2017-11-20: qty 2

## 2017-11-20 MED ORDER — SODIUM CHLORIDE 0.9% FLUSH
3.0000 mL | Freq: Two times a day (BID) | INTRAVENOUS | Status: DC
  Administered 2017-11-20 – 2017-11-22 (×5): 3 mL via INTRAVENOUS

## 2017-11-20 MED ORDER — CARVEDILOL 3.125 MG PO TABS
3.1250 mg | ORAL_TABLET | Freq: Two times a day (BID) | ORAL | Status: DC
  Administered 2017-11-20 – 2017-11-22 (×4): 3.125 mg via ORAL
  Filled 2017-11-20 (×4): qty 1

## 2017-11-20 MED ORDER — TIROFIBAN HCL IV 12.5 MG/250 ML
INTRAVENOUS | Status: AC | PRN
  Administered 2017-11-20: 0.075 ug/kg/min via INTRAVENOUS

## 2017-11-20 MED ORDER — SODIUM CHLORIDE 0.9% FLUSH: 3.0000 mL | Freq: Two times a day (BID) | INTRAVENOUS | Status: DC

## 2017-11-20 MED ORDER — FUROSEMIDE 10 MG/ML IJ SOLN
INTRAMUSCULAR | Status: AC
  Filled 2017-11-20: qty 4

## 2017-11-20 MED ORDER — TICAGRELOR 90 MG PO TABS
ORAL_TABLET | ORAL | Status: AC
  Filled 2017-11-20: qty 1

## 2017-11-20 MED ORDER — ASPIRIN 81 MG PO CHEW
324.0000 mg | CHEWABLE_TABLET | Freq: Once | ORAL | Status: AC
  Administered 2017-11-20: 324 mg via ORAL
  Filled 2017-11-20: qty 4

## 2017-11-20 MED ORDER — VERAPAMIL HCL 2.5 MG/ML IV SOLN
INTRAVENOUS | Status: DC | PRN
  Administered 2017-11-20: 10 mL via INTRA_ARTERIAL

## 2017-11-20 MED ORDER — TICAGRELOR 90 MG PO TABS
ORAL_TABLET | ORAL | Status: DC | PRN
  Administered 2017-11-20: 180 mg via ORAL

## 2017-11-20 MED ORDER — NITROGLYCERIN 1 MG/10 ML FOR IR/CATH LAB
INTRA_ARTERIAL | Status: DC | PRN
  Administered 2017-11-20 (×2): 200 ug via INTRACORONARY

## 2017-11-20 MED ORDER — HEPARIN SODIUM (PORCINE) 1000 UNIT/ML IJ SOLN
INTRAMUSCULAR | Status: DC | PRN
  Administered 2017-11-20: 8000 [IU] via INTRAVENOUS
  Administered 2017-11-20: 5000 [IU] via INTRAVENOUS
  Administered 2017-11-20: 2000 [IU] via INTRAVENOUS

## 2017-11-20 MED ORDER — LAMOTRIGINE 150 MG PO TABS
300.0000 mg | ORAL_TABLET | Freq: Every day | ORAL | Status: DC
  Administered 2017-11-21 (×2): 300 mg via ORAL
  Filled 2017-11-20 (×3): qty 2

## 2017-11-20 MED ORDER — VERAPAMIL HCL 2.5 MG/ML IV SOLN
INTRAVENOUS | Status: AC
  Filled 2017-11-20: qty 2

## 2017-11-20 MED ORDER — TIROFIBAN HCL IV 12.5 MG/250 ML
0.1500 ug/kg/min | INTRAVENOUS | Status: DC
  Administered 2017-11-20: 0.15 ug/kg/min via INTRAVENOUS
  Filled 2017-11-20: qty 250
  Filled 2017-11-20 (×2): qty 100

## 2017-11-20 MED ORDER — LAMOTRIGINE 150 MG PO TABS: 300.0000 mg | ORAL_TABLET | Freq: Every day | ORAL | Status: DC

## 2017-11-20 MED ORDER — ACETAMINOPHEN 325 MG PO TABS
650.0000 mg | ORAL_TABLET | ORAL | Status: DC | PRN
  Administered 2017-11-20 – 2017-11-21 (×2): 650 mg via ORAL
  Filled 2017-11-20 (×2): qty 2

## 2017-11-20 MED ORDER — ONDANSETRON HCL 4 MG/2ML IJ SOLN: 4.0000 mg | Freq: Four times a day (QID) | INTRAMUSCULAR | Status: DC | PRN

## 2017-11-20 MED ORDER — SODIUM CHLORIDE 0.9 % IV SOLN
250.0000 mL | INTRAVENOUS | Status: DC | PRN
  Administered 2017-11-20: 250 mL via INTRAVENOUS

## 2017-11-20 MED ORDER — ADENOSINE 6 MG/2ML IV SOLN
INTRAVENOUS | Status: AC
  Filled 2017-11-20: qty 2

## 2017-11-20 MED ORDER — LIDOCAINE HCL (PF) 1 % IJ SOLN
INTRAMUSCULAR | Status: AC
  Filled 2017-11-20: qty 30

## 2017-11-20 MED ORDER — HEPARIN (PORCINE) IN NACL 100-0.45 UNIT/ML-% IJ SOLN
1200.0000 [IU]/h | INTRAMUSCULAR | Status: DC
  Administered 2017-11-20: 1200 [IU]/h via INTRAVENOUS
  Filled 2017-11-20: qty 250

## 2017-11-20 MED ORDER — ENOXAPARIN SODIUM 40 MG/0.4ML ~~LOC~~ SOLN
40.0000 mg | SUBCUTANEOUS | Status: DC
  Administered 2017-11-21 – 2017-11-22 (×2): 40 mg via SUBCUTANEOUS
  Filled 2017-11-20 (×2): qty 0.4

## 2017-11-20 MED ORDER — HEPARIN (PORCINE) IN NACL 1000-0.9 UT/500ML-% IV SOLN
INTRAVENOUS | Status: AC
  Filled 2017-11-20: qty 1000

## 2017-11-20 MED ORDER — METOPROLOL TARTRATE 5 MG/5ML IV SOLN
5.0000 mg | Freq: Once | INTRAVENOUS | Status: AC
  Administered 2017-11-20: 5 mg via INTRAVENOUS
  Filled 2017-11-20: qty 5

## 2017-11-20 MED ORDER — SODIUM CHLORIDE 0.9 % IV SOLN: 250.0000 mL | INTRAVENOUS | Status: DC | PRN

## 2017-11-20 MED ORDER — LABETALOL HCL 5 MG/ML IV SOLN: 10.0000 mg | INTRAVENOUS | Status: AC | PRN

## 2017-11-20 MED ORDER — LORATADINE 10 MG PO TABS
10.0000 mg | ORAL_TABLET | Freq: Every day | ORAL | Status: DC
  Administered 2017-11-21 – 2017-11-22 (×2): 10 mg via ORAL
  Filled 2017-11-20 (×2): qty 1

## 2017-11-20 MED ORDER — MIDAZOLAM HCL 2 MG/2ML IJ SOLN
INTRAMUSCULAR | Status: DC | PRN
  Administered 2017-11-20 (×2): 1 mg via INTRAVENOUS

## 2017-11-20 MED ORDER — IOHEXOL 350 MG/ML SOLN
INTRAVENOUS | Status: DC | PRN
  Administered 2017-11-20: 160 mL via INTRAVENOUS

## 2017-11-20 SURGICAL SUPPLY — 23 items
BALLN SAPPHIRE 2.0X12 (BALLOONS) ×2
BALLN SAPPHIRE 3.0X15 (BALLOONS) ×2
BALLN SAPPHIRE ~~LOC~~ 4.0X15 (BALLOONS) ×2 IMPLANT
BALLN ~~LOC~~ EUPHORA RX 4.5X8 (BALLOONS) ×2
BALLOON SAPPHIRE 2.0X12 (BALLOONS) ×1 IMPLANT
BALLOON SAPPHIRE 3.0X15 (BALLOONS) ×1 IMPLANT
BALLOON ~~LOC~~ EUPHORA RX 4.5X8 (BALLOONS) ×1 IMPLANT
CATH INFINITI 5 FR JL3.5 (CATHETERS) ×2 IMPLANT
CATH INFINITI 5FR ANG PIGTAIL (CATHETERS) ×2 IMPLANT
CATH LAUNCHER 6FR EBU3.5 (CATHETERS) ×2 IMPLANT
CATH OPTITORQUE TIG 4.0 5F (CATHETERS) ×2 IMPLANT
DEVICE RAD COMP TR BAND LRG (VASCULAR PRODUCTS) ×2 IMPLANT
GLIDESHEATH SLEND A-KIT 6F 22G (SHEATH) ×2 IMPLANT
GUIDEWIRE INQWIRE 1.5J.035X260 (WIRE) ×1 IMPLANT
INQWIRE 1.5J .035X260CM (WIRE) ×2
KIT ENCORE 26 ADVANTAGE (KITS) ×2 IMPLANT
KIT HEART LEFT (KITS) ×2 IMPLANT
PACK CARDIAC CATHETERIZATION (CUSTOM PROCEDURE TRAY) ×2 IMPLANT
STENT ORSIRO 3.5X22 (Permanent Stent) ×2 IMPLANT
SYR MEDRAD MARK V 150ML (SYRINGE) ×2 IMPLANT
TRANSDUCER W/STOPCOCK (MISCELLANEOUS) ×2 IMPLANT
TUBING CIL FLEX 10 FLL-RA (TUBING) ×2 IMPLANT
WIRE RUNTHROUGH .014X180CM (WIRE) ×2 IMPLANT

## 2017-11-20 NOTE — H&P (Addendum)
History & Physical    Patient ID: TRAYVION EMBLETON MRN: [id number], DOB/AGE: 01/04/1961   Admit date: 11/20/2017   Primary Physician: Clovis Riley, Elbert Ewings.August Saucer, MD Primary Cardiologist: Dr. Mayford Knife  Patient Profile    57 yo male with OSA on CPAP, obesity, and Bipolar who presented to [facility] with chest pain. Noted to have a + troponin and ongoing chest pain and transferred to [facility] for cardiac cath.   Past Medical History   Past Medical History:  Diagnosis Date  . Allergy   . Bipolar disorder (HCC)   . Obesity   . OSA (obstructive sleep apnea)    mild OSA with AHI 15.2/hr on CPAP at 12cm H2O - Dr. Mayford Knife    History reviewed. No pertinent surgical history.   Allergies  Allergies  Allergen Reactions  . Penicillins     Has patient had a PCN reaction causing immediate rash, facial/tongue/throat swelling, SOB or lightheadedness with hypotension: Unknown Has patient had a PCN reaction causing severe rash involving mucus membranes or skin necrosis: Unknown Has patient had a PCN reaction that required hospitalization: Unknown Patient had a PCN reaction occurring within the last 10 years: No If all of the above answers are "NO", then may proceed with Cephalosporin use.     History of Present Illness    57 yo male with PMH of OSA on CPAP, obesity, and Bipolar. Reports he is followed by his PCP as a outpatient and had a normal physical/labs recently. He has been seen by Dr. Mayford Knife for his OSA and is on CPAP. Denies any family hx of CAD. He is not a smoker, but vapes. States about a week ago he was awoken from his sleep with centralized chest pain with radiation down the left arm that lasted about 30 minutes and resolved. No recurrence until this morning. States he was eating breakfast around 7:30am when he developed centralized chest pain, no radiation that was actually more intense than what he experienced the week before. Symptoms persisted and he presented to the [facility].   In the ED his labs  showed stable electrolytes, Cr 0.99, Trop 0.66, Hgb 14.8, INR 0.99. EKG showed SR with slight ST changes in the anterior leads. He was placed on IV heparin, and IV nitro with improvement in his chest pain. Case discussed with Dr. Eldridge Dace who agreed to transfer for cardiac cath. On arrival he was still having 5/10 chest pain.   Home Medications    Prior to Admission medications   Medication Sig Start Date Nathanael Krist Date Taking? Authorizing Provider  cetirizine (ZYRTEC) 10 MG tablet Take 1 tablet (10 mg total) by mouth daily. 04/03/13  Yes Sherren Mocha, MD  Cholecalciferol (VITAMIN D-3) 1000 UNITS CAPS Take 1 capsule by mouth daily.    Yes [provider]  lamoTRIgine (LAMICTAL) 150 MG tablet Take 2 tablets (300 mg total) by mouth daily. Please only use generic from manufacturer pt has been on prior OR brand name. Do NOT USE GENERIC FROM [place] - MEDICALLY NECESSARY 04/03/13  Yes Sherren Mocha, MD  Multiple Vitamin (MULTIVITAMIN) capsule Take 1 capsule by mouth daily.   Yes [provider]  Omega-3 Fatty Acids (FISH OIL PO) Take 1 tablet by mouth daily.   Yes [provider]  cyclobenzaprine (FLEXERIL) 10 MG tablet Take 1 tablet (10 mg total) by mouth 3 (three) times daily as needed for muscle spasms. Patient not taking: Reported on 11/20/2017 03/19/15   Peyton Najjar, MD  HYDROcodone-acetaminophen [facility]) [phone]  MG tablet Take 1 tablet by mouth every 6 (six) hours as needed. Patient not taking: Reported on 11/20/2017 03/19/15   Peyton Najjar, MD  naproxen (NAPROSYN) 500 MG tablet Take 1 tablet (500 mg total) by mouth 2 (two) times daily with a meal. Patient not taking: Reported on 11/20/2017 03/19/15   Peyton Najjar, MD    Family History    Family History  Problem Relation Age of Onset  . CVA Mother   . CVA Father   . Cancer Father        Prostate  . Hypertension Father   . Diabetes Mellitus II Sister   . Mental illness Brother     Social History    Social History    Socioeconomic History  . Marital status: Married    Spouse name: Not on file  . Number of children: Not on file  . Years of education: Not on file  . Highest education level: Not on file  Occupational History  . Not on file  Social Needs  . Financial resource strain: Not on file  . Food insecurity:    Worry: Not on file    Inability: Not on file  . Transportation needs:    Medical: Not on file    Non-medical: Not on file  Tobacco Use  . Smoking status: Current Some Day Smoker    Types: Cigarettes    Last attempt to quit: 12/16/2004    Years since quitting: 12.9  . Smokeless tobacco: Never Used  Substance and Sexual Activity  . Alcohol use: Yes    Alcohol/week: 1.8 - 2.4 oz    Types: 3 - 4 Standard drinks or equivalent per week  . Drug use: No  . Sexual activity: Not on file  Lifestyle  . Physical activity:    Days per week: Not on file    Minutes per session: Not on file  . Stress: Not on file  Relationships  . Social connections:    Talks on phone: Not on file    Gets together: Not on file    Attends religious service: Not on file    Active member of club or organization: Not on file    Attends meetings of clubs or organizations: Not on file    Relationship status: Not on file  . Intimate partner violence:    Fear of current or ex partner: Not on file    Emotionally abused: Not on file    Physically abused: Not on file    Forced sexual activity: Not on file  Other Topics Concern  . Not on file  Social History Narrative  . Not on file     Review of Systems    See HPI  All other systems reviewed and are otherwise negative except as noted above.  Physical Exam    Blood pressure 126/78, pulse 66, temperature (!) 97.4 F (36.3 C), temperature source Oral, resp. rate 19, height 5\' 9"  (1.753 m), weight 274 lb (124.3 kg), SpO2 100 %.  General: Pleasant, obese WM, NAD Psych: Normal affect. Neuro: Alert and oriented X 3. Moves all extremities  spontaneously. HEENT: Normal  Neck: Supple without bruits or JVD. Lungs:  Resp regular and unlabored, CTA. Heart: RRR no murmurs. Abdomen: Soft, non-tender, non-distended, BS + x 4.  Extremities: No clubbing, cyanosis or edema. DP/PT/Radials 2+ and equal bilaterally.  Labs    Troponin [facility] of Care Test) Recent Labs    11/20/17 1052  TROPIPOC 0.66*  No results for input(s): CKTOTAL, CKMB, TROPONINI in the last 72 hours. Lab Results  Component Value Date   WBC 6.2 11/20/2017   HGB 14.8 11/20/2017   HCT 43.9 11/20/2017   MCV 98.7 11/20/2017   PLT 205 11/20/2017    Recent Labs  Lab 11/20/17 1044  NA 144  K 4.4  CL 108  CO2 29  BUN 14  CREATININE 0.99  CALCIUM 9.8  GLUCOSE 163*   No results found for: CHOL, HDL, LDLCALC, TRIG No results found for: [facility]   Radiology Studies    Dg Chest 2 View  Result Date: 11/20/2017 CLINICAL DATA:  Central chest pain EXAM: CHEST - 2 VIEW COMPARISON:  None. FINDINGS: Cardiac shadow is within normal limits. The lungs are well aerated bilaterally with the exception of some mild lingular atelectasis. No pleural effusion is seen. No bony abnormality is noted. IMPRESSION: Mild lingular atelectasis. Electronically Signed   By: Alcide CleverMark  Lukens M.D.   On: 11/20/2017 09:39    ECG & Cardiac Imaging    EKG: SR with slight ST change in anterior leads  Assessment & Plan    57 yo male with OSA on CPAP, obesity, and Bipolar who [facility]. Noted to have a + troponin and ongoing chest pain and [facility]   1. NSTEMI: Had an episode of chest pain about a week ago that awoke him from sleep. Recurrence of similar but worse pain today at 7:30am. [facility]. Placed on IV heparin and nitro. Given symptoms and + troponin, will plan for cath today.  -- The patient understands that risks included but are not limited to stroke (1 in 1000), death (1 in 1000), kidney failure [usually  temporary] (1 in 500), bleeding (1 in 200), allergic reaction [possibly serious] (1 in 200).  -- remains on IV heparin and nitro -- further recommendations post cath  2. OSA on CPAP: followed by Dr. Mayford Knifeurner as an outpatient.   3. Obesity: Will need to discuss the need for lifestyle modifications.   Severity of Illness: The appropriate patient status for this patient is INPATIENT. Inpatient status is judged to be reasonable and necessary in order to provide the required intensity of service to ensure the patient's safety. The patient's presenting symptoms, physical exam findings, and initial radiographic and laboratory data in the context of their chronic comorbidities is felt to place them at high risk for further clinical deterioration. Furthermore, it is not anticipated that the patient will be medically stable for discharge from the hospital within 2 midnights of admission. The following factors support the patient status of inpatient.   " The patient's presenting symptoms include chest pain. " The worrisome physical exam findings include obese. " The initial radiographic and laboratory data are worrisome because of + trop. " The chronic co-morbidities include OSA.   * I certify that at the point of admission it is my clinical judgment that the patient will require inpatient hospital care spanning beyond 2 midnights from the point of admission due to high intensity of service, high risk for further deterioration and high frequency of surveillance required.*   Signed, Laverda PageLindsay Roberts, NP-C Pager [phone] 11/20/2017, 12:29 PM  I have seen and examined the patient and agree with the findings and plan, as documented in the PA/NP's note, with the following additions/changes. Ms. Jeannie FendKeefer is a 57 year old man with history of bipolar disorder, obesity, and sleep apnea on CPAP, presenting  with recurrent chest pain.  He had an episode a week ago that was attributed to GERD or muscular skeletal  pain by urgent care.  After using antacid and naproxen, symptoms resolved until this morning when he had recurrence of substernal chest pain and diaphoresis.  The pain did not resolve promptly, which ultimately led him to come to [facility] long ED.  EKG showed subtle anterior ST elevation not diagnostic for STEMI.  However, point-of-care troponin was positive at 0.66.  Given ongoing chest pain, Mr. Yeo was referred to [facility] for urgent cardiac catheterization.  Physical exam is notable for an obese man lying comfortably in bed.  His wife is at the bedside.  Heart sounds are distant but regular without murmurs.  Lungs are clear bilaterally.  There is no lower extremity edema.  Radial pulses are 2+ bilaterally.  In summary, Mr. Roes is a 57 year old man presenting with recurrent chest pain and elevated troponin consistent with NSTEMI.  Given ongoing chest pain I agree with urgent cardiac catheterization and possible coronary intervention.  I have reviewed the risks, indications, and alternatives to cardiac catheterization, possible angioplasty, and stenting with the patient. Risks include but are not limited to bleeding, infection, vascular injury, stroke, myocardial infection, arrhythmia, kidney injury, radiation-related injury in the case of prolonged fluoroscopy use, emergency cardiac surgery, and death. The patient understands the risks of serious complication is 1-2 in 1000 with diagnostic cardiac cath and 1-2% or less with angioplasty/stenting.  Yvonne Kendall, MD [facility] Pager: [phone]

## 2017-11-20 NOTE — Progress Notes (Signed)
CRITICAL VALUE ALERT  Critical Value:  Troponin  Date & Time Notified:  11/20/2017 @ 1830  Provider Notified: NP Jeraldine LootsHammond  Orders Received/Actions taken: No further orders at this time. Continue with scheduled troponin draws to determine peak.

## 2017-11-20 NOTE — Brief Op Note (Signed)
BRIEF CARDIAC CATHETERIZATION NOTE  DATE: 11/20/2017 TIME: 2:23 PM  PATIENT:  Charlies Silverslair A Bromell II  57 y.o. male  PRE-OPERATIVE DIAGNOSIS:  NSTEMI  POST-OPERATIVE DIAGNOSIS:  NSTEMI  PROCEDURE:  Procedure(s) with comments: LEFT HEART CATH AND CORONARY ANGIOGRAPHY (N/A) CORONARY STENT INTERVENTION (N/A) - LAD   SURGEON:  Surgeon(s) and Role:    * Amaro Mangold, MD - Primary  FINDINGS: 1. 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  There is also occlusion of the distal LAD, likely due to embolization from the proximal stenosis. 2. No significant CAD involving LCx or RCA. 3. Moderately reduced LVEF with mid and apical anterior hypokinesis.  MIldly to moderately elevated LVEDP. 4. Successful PCI to proximal LAD using Orsiro 3.5 x 22 mm drug eluting stent with 0% residual stenosis.  Distal LAD occlusion was Dottered with a balloon with improvement in flow.  RECOMMENDATIONS: 1. Admit to ICU for post-PCI monitoring and titration of NTG infusion for relief of chest pain due to small vessel disease (embolization +/- microvascular dysfunction). 2. Tirofiban infusion x 18 hours. 3. DAPT with ASA and ticagrelor for at least 12 months. 4. Furosemide 40 mg IV x 1 given at Ozelle Brubacher of case.  May need to be redosed based on urine output.  Yvonne Kendallhristopher Wavie Hashimi, MD [facility] Pager: [phone]

## 2017-11-20 NOTE — ED Notes (Signed)
Dr Juleen ChinaKohut notified of patient's troponin result of 0.66.

## 2017-11-20 NOTE — ED Provider Notes (Signed)
[facility]-EMERGENCY DEPT Provider Note   CSN: [id number] Arrival date & time: 11/20/17  0845     History   Chief Complaint Chief Complaint  Patient presents with  . Chest Pain    HPI Luke [name] is a 57 y.o. male.  HPI   56yM with CP. Around 0715 this morning he began having pressure in the center of his chest and also felt like he was having discomfort in his lower molars. Pain progressed through the morning and on the way to work became diaphoretic. Symptoms have improved at this point but still present. No dyspnea. No cough. No fever or chills. Hasn't noticed a positional component. No unusual leg pain or swelling. No hx of CAD that he is aware of. He reports an episode of similar but more mild symptoms 1 week ago. He reports he went to a walk-in clinic and told that possibly reflux or "inflammation of his cartilage."   Past Medical History:  Diagnosis Date  . Allergy   . Bipolar disorder (HCC)   . Obesity   . OSA (obstructive sleep apnea)    mild OSA with AHI 15.2/hr on CPAP at 12cm H2O - Dr. Mayford Knife    Patient Active Problem List   Diagnosis Date Noted  . Bipolar disorder (HCC)   . OSA (obstructive sleep apnea)   . Obesity     History reviewed. No pertinent surgical history.      Home Medications    Prior to Admission medications   Medication Sig Start Date End Date Taking? Authorizing Provider  cetirizine (ZYRTEC) 10 MG tablet Take 1 tablet (10 mg total) by mouth daily. Patient not taking: Reported on 03/19/2015 04/03/13   Sherren Mocha, MD  Cholecalciferol (VITAMIN D-3) 1000 UNITS CAPS Take by mouth.    [provider]  cyclobenzaprine (FLEXERIL) 10 MG tablet Take 1 tablet (10 mg total) by mouth 3 (three) times daily as needed for muscle spasms. 03/19/15   Peyton Najjar, MD  fenofibrate 160 MG tablet Take 160 mg by mouth daily.    [provider]  HYDROcodone-acetaminophen (NORCO) 5-325 MG tablet Take 1 tablet by  mouth every 6 (six) hours as needed. 03/19/15   Peyton Najjar, MD  lamoTRIgine (LAMICTAL) 150 MG tablet Take 2 tablets (300 mg total) by mouth daily. Please only use generic from manufacturer pt has been on prior OR brand name. Do NOT USE GENERIC FROM [place] - MEDICALLY NECESSARY 04/03/13   Sherren Mocha, MD  Multiple Vitamin (MULTIVITAMIN) capsule Take 1 capsule by mouth daily.    [provider]  naproxen (NAPROSYN) 500 MG tablet Take 1 tablet (500 mg total) by mouth 2 (two) times daily with a meal. 03/19/15   Peyton Najjar, MD  Omega-3 Fatty Acids (FISH OIL PO) Take 1 tablet by mouth daily.    [provider]    Family History Family History  Problem Relation Age of Onset  . CVA Mother   . CVA Father   . Cancer Father        Prostate  . Hypertension Father   . Diabetes Mellitus [name] Sister   . Mental illness Brother     Social History Social History   Tobacco Use  . Smoking status: Current Some Day Smoker    Types: Cigarettes    Last attempt to quit: 12/16/2004    Years since quitting: 12.9  . Smokeless tobacco: Never Used  Substance Use Topics  .  Alcohol use: Yes    Alcohol/week: 1.8 - 2.4 oz    Types: 3 - 4 Standard drinks or equivalent per week  . Drug use: No     Allergies   Penicillins   Review of Systems Review of Systems  All systems reviewed and negative, other than as noted in HPI.  Physical Exam Updated Vital Signs BP (!) 199/119 (BP Location: Right Arm)   Pulse 76   Temp (!) 97.4 F (36.3 C) (Oral)   Resp (!) 23   Ht 5\' 9"  (1.753 m)   Wt 124.3 kg (274 lb)   SpO2 99%   BMI 40.46 kg/m   Physical Exam  Constitutional: He appears well-developed and well-nourished. No distress.  HENT:  Head: Normocephalic and atraumatic.  Eyes: Conjunctivae are normal. Right eye exhibits no discharge. Left eye exhibits no discharge.  Neck: Neck supple.  Cardiovascular: Normal rate, regular rhythm and normal heart sounds. Exam reveals no gallop  and no friction rub.  No murmur heard. Pulmonary/Chest: Effort normal and breath sounds normal. No respiratory distress.  Abdominal: Soft. He exhibits no distension. There is no tenderness.  Musculoskeletal: He exhibits no edema or tenderness.  Lower extremities symmetric as compared to each other. No calf tenderness. Negative Homan's. No palpable cords.   Neurological: He is alert.  Skin: Skin is warm and dry.  Psychiatric: He has a normal mood and affect. His behavior is normal. Thought content normal.  Nursing note and vitals reviewed.    ED Treatments / Results  Labs (all labs ordered are listed, but only abnormal results are displayed) Labs Reviewed  BASIC METABOLIC PANEL - Abnormal; Notable for the following components:      Result Value   Glucose, Bld 163 (*)    All other components within normal limits  TROPONIN I - Abnormal; Notable for the following components:   Troponin I 10.58 (*)    All other components within normal limits  TROPONIN I - Abnormal; Notable for the following components:   Troponin I 16.63 (*)    All other components within normal limits  TROPONIN I - Abnormal; Notable for the following components:   Troponin I 16.11 (*)    All other components within normal limits  BASIC METABOLIC PANEL - Abnormal; Notable for the following components:   Glucose, Bld 125 (*)    All other components within normal limits  LIPID PANEL - Abnormal; Notable for the following components:   Triglycerides 252 (*)    HDL 30 (*)    VLDL 50 (*)    All other components within normal limits  I-STAT TROPONIN, ED - Abnormal; Notable for the following components:   Troponin i, poc 0.66 (*)    All other components within normal limits  MRSA PCR SCREENING  CBC  APTT  PROTIME-INR  PLATELET COUNT  CBC  HEMOGLOBIN A1C  POCT ACTIVATED CLOTTING TIME  POCT ACTIVATED CLOTTING TIME    EKG EKG Interpretation  Date/Time:  Tuesday November 20 2017 08:52:36 EDT Ventricular Rate:    83 PR Interval:    QRS Duration: 86 QT Interval:  339 QTC Calculation: 399 R Axis:   14 Text Interpretation:  Sinus rhythm Low voltage, precordial leads ST elev, probable normal early repol pattern No old tracing to compare Confirmed by Raeford RazorKohut, Janiyla Long [phone]([id number]) on 11/20/2017 10:38:30 AM   Radiology Dg Chest 2 View  Result Date: 11/20/2017 CLINICAL DATA:  Central chest pain EXAM: CHEST - 2 VIEW COMPARISON:  None. FINDINGS: Cardiac shadow  is within normal limits. The lungs are well aerated bilaterally with the exception of some mild lingular atelectasis. No pleural effusion is seen. No bony abnormality is noted. IMPRESSION: Mild lingular atelectasis. Electronically Signed   By: Alcide Clever M.D.   On: 11/20/2017 09:39    Procedures Procedures (including critical care time)  CRITICAL CARE Performed by: Raeford Razor Total critical care time: 40 minutes Critical care time was exclusive of separately billable procedures and treating other patients. Critical care was necessary to treat or prevent imminent or life-threatening deterioration. Critical care was time spent personally by me on the following activities: development of treatment plan with patient and/or surrogate as well as nursing, discussions with consultants, evaluation of patient's response to treatment, examination of patient, obtaining history from patient or surrogate, ordering and performing treatments and interventions, ordering and review of laboratory studies, ordering and review of radiographic studies, pulse oximetry and re-evaluation of patient's condition.   Medications Ordered in ED Medications  nitroGLYCERIN (NITROSTAT) SL tablet 0.4 mg (0.4 mg Sublingual Given 11/20/17 1112)  heparin injection 4,000 Units (has no administration in time range)  heparin ADULT infusion 100 units/mL (25000 units/271mL sodium chloride 0.45%) (has no administration in time range)  nitroGLYCERIN 50 mg in dextrose 5 % 250 mL (0.2 mg/mL) infusion  (has no administration in time range)  aspirin chewable tablet 324 mg (324 mg Oral Given 11/20/17 1111)  metoprolol tartrate (LOPRESSOR) injection 5 mg (5 mg Intravenous Given 11/20/17 1113)     Initial Impression / Assessment and Plan / ED Course  I have reviewed the triage vital signs and the nursing notes.  Pertinent labs & imaging results that were available during my care of the patient were reviewed by me and considered in my medical decision making (see chart for details).     56yM with symptoms concerning for ACS. Symptoms today started around 0715 and initial troponin did come back elevated. Pain much improved, but not resolved after nitroglycein. ASA. Heparin. Metoprolol. EKG is abnormal but I didn't feel it was a STEMI. Discussed with Dr Eldridge Dace. Plan to transfer to [facility] for probable catheterization later today. Pt/wife updated.   Final Clinical Impressions(s) / ED Diagnoses   Final diagnoses:  NSTEMI (non-ST elevated myocardial infarction) [facility])    ED Discharge Orders    None       Raeford Razor, MD 11/22/17 1651

## 2017-11-20 NOTE — Progress Notes (Signed)
RT set up patient home unit cpap at beside. NO O2 bleed in needed. Patient is able to place himself on and off CPAP.

## 2017-11-20 NOTE — Progress Notes (Signed)
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 Penicillins     Has patient had a PCN reaction causing immediate rash, facial/tongue/throat swelling, SOB or lightheadedness with hypotension: Unknown Has patient had a PCN reaction causing severe rash involving mucus membranes or skin necrosis: Unknown Has patient had a PCN reaction that required hospitalization: Unknown Patient had a PCN reaction occurring within the last 10 years: No If all of the above answers are "NO", then may proceed with Cephalosporin use.     Patient Measurements: Height: 5\' 9"  (175.3 cm) Weight: 273 lb 9.5 oz (124.1 kg) IBW/kg (Calculated) : 70.7 Heparin Dosing Weight: 99.2kg  Vital Signs: Temp: 97.8 F (36.6 C) (08/06 1525) Temp Source: Oral (08/06 1525) BP: 130/84 (08/06 1525) Pulse Rate: 70 (08/06 1525)  Labs: Recent Labs    11/20/17 1044 11/20/17 1114  HGB 14.8  --   HCT 43.9  --   PLT 205  --   APTT  --  29  LABPROT  --  13.0  INR  --  0.99  CREATININE 0.99  --     Estimated Creatinine Clearance: 111.1 mL/min (by C-G formula based on SCr of 0.99 mg/dL).   Medical History: Past Medical History:  Diagnosis Date  . Allergy   . Bipolar disorder (HCC)   . Obesity   . OSA (obstructive sleep apnea)    mild OSA with AHI 15.2/hr on CPAP at 12cm H2O - Dr. Mayford Knifeurner     Assessment: 57 yo male presented  with chest pain and discomfort in his lower jaw.  No PTA AC, CBC WNL.   Started on IV heparin for ACS/STEMI, [facility].  Now s/p Cath/PCI: single vessel CAD, PCI to LAD Pharmacy consulted to continue Aggrastat infusion x 18 hours post PCI.   SCR 0.99 , CrCl is >100 ml/min  Aggrastat infusion in cath lab at 0.04875mcg/kg/min.  For CrCl >7460ml/min, recommended rate is 0.6015mcg/kg/min.  RN on 2H confirmed with Dr. Okey DupreEnd to increase  rate to full dose 0.4815mcg/kg/min.   Heparin infusion discontinued post cath/PCI orders. No bleeding reported per RN.  Pltc 205k wnl, H/H wnl.   Goal of Therapy:  Monitor platelets by anticoagulation protocol:   Plan:  Increase Aggrastat infusion to 0.15 mcg/kg/min. Continue x 18hr post PCI, should stop at 0815 tomorrow 11/21/17.  F/u 6h pltc and cbc in AM.  Luke [name], RPh Clinical Pharmacist Pager: [phone] Please check AMION for all [facility] phone numbers After 10:00 PM, call Main Pharmacy [phone] 11/20/2017, 4:03 PM

## 2017-11-20 NOTE — ED Notes (Signed)
Patient belongings sent with wife

## 2017-11-20 NOTE — Progress Notes (Signed)
ANTICOAGULATION CONSULT NOTE - Initial Consult  Pharmacy Consult for heparin Indication: chest pain/ACS  Allergies  Allergen Reactions  . Penicillins     Has patient had a PCN reaction causing immediate rash, facial/tongue/throat swelling, SOB or lightheadedness with hypotension: Unknown Has patient had a PCN reaction causing severe rash involving mucus membranes or skin necrosis: Unknown Has patient had a PCN reaction that required hospitalization: Unknown Patient had a PCN reaction occurring within the last 10 years: No If all of the above answers are "NO", then may proceed with Cephalosporin use.     Patient Measurements: Height: 5\' 9"  (175.3 cm) Weight: 274 lb (124.3 kg) IBW/kg (Calculated) : 70.7 Heparin Dosing Weight: 99.2kg  Vital Signs: Temp: 97.4 F (36.3 C) (08/06 1034) Temp Source: Oral (08/06 1034) BP: 199/119 (08/06 0855) Pulse Rate: 76 (08/06 1034)  Labs: Recent Labs    11/20/17 1044  HGB 14.8  HCT 43.9  PLT 205    CrCl cannot be calculated (No order found.).   Medical History: Past Medical History:  Diagnosis Date  . Allergy   . Bipolar disorder (HCC)   . Obesity   . OSA (obstructive sleep apnea)    mild OSA with AHI 15.2/hr on CPAP at 12cm H2O - Dr. Mayford Knifeurner     Assessment: 57 yo male presents with chest pain and discomfort in his lower jaw.  No PTA AC, CBC WNL.   Pharmacy consulted to dose heparin for ACS/STEMI.  Goal of Therapy:  Heparin level 0.3-0.7 units/ml Monitor platelets by anticoagulation protocol:   Plan:  -Baseline PTT, PT/INR ordered stat -Heparin bolus 4000 units x 1(already ordered)  -start heparin drip 1200 units/hr -Heparin level in 6 hours -Daily CBC  Arley PhenixEllen Blayde Bacigalupi RPh 11/20/2017, 11:02 AM Pager [phone]

## 2017-11-21 ENCOUNTER — Inpatient Hospital Stay (HOSPITAL_COMMUNITY): Payer: 59

## 2017-11-21 ENCOUNTER — Encounter (HOSPITAL_COMMUNITY): Payer: Self-pay | Admitting: Internal Medicine

## 2017-11-21 DIAGNOSIS — E782 Mixed hyperlipidemia: Secondary | ICD-10-CM

## 2017-11-21 DIAGNOSIS — G4733 Obstructive sleep apnea (adult) (pediatric): Secondary | ICD-10-CM

## 2017-11-21 DIAGNOSIS — I5030 Unspecified diastolic (congestive) heart failure: Secondary | ICD-10-CM

## 2017-11-21 LAB — BASIC METABOLIC PANEL
ANION GAP: 13 (ref 5–15)
BUN: 13 mg/dL (ref 6–20)
CALCIUM: 9.2 mg/dL (ref 8.9–10.3)
CHLORIDE: 100 mmol/L (ref 98–111)
CO2: 26 mmol/L (ref 22–32)
CREATININE: 1.07 mg/dL (ref 0.61–1.24)
GFR calc non Af Amer: >60 mL/min (ref >60)
Glucose, Bld: 125 mg/dL — ABNORMAL HIGH (ref 70–99)
Potassium: 3.6 mmol/L (ref 3.5–5.1)
SODIUM: 139 mmol/L (ref 135–145)

## 2017-11-21 LAB — CBC
HCT: 42.5 % (ref 39.0–52.0)
HEMOGLOBIN: 14.4 g/dL (ref 13.0–17.0)
MCH: 33.1 pg (ref 26.0–34.0)
MCHC: 33.9 g/dL (ref 30.0–36.0)
MCV: 97.7 fL (ref 78.0–100.0)
PLATELETS: 196 10*3/uL (ref 150–400)
RBC: 4.35 MIL/uL (ref 4.22–5.81)
RDW: 12.6 % (ref 11.5–15.5)
WBC: 9 10*3/uL (ref 4.0–10.5)

## 2017-11-21 LAB — POCT ACTIVATED CLOTTING TIME
ACTIVATED CLOTTING TIME: 241 s
Activated Clotting Time: 290 seconds

## 2017-11-21 LAB — HEMOGLOBIN A1C
Hgb A1c MFr Bld: 5.5 % (ref 4.8–5.6)
MEAN PLASMA GLUCOSE: 111.15 mg/dL

## 2017-11-21 LAB — LIPID PANEL
CHOL/HDL RATIO: 4.4 ratio
CHOLESTEROL: 131 mg/dL (ref 0–200)
HDL: 30 mg/dL — AB (ref >40)
LDL Cholesterol: 51 mg/dL (ref 0–99)
Triglycerides: 252 mg/dL — ABNORMAL HIGH (ref <150)
VLDL: 50 mg/dL — AB (ref 0–40)

## 2017-11-21 LAB — ECHOCARDIOGRAM COMPLETE
Height: 69 in
Weight: 4377.45 oz

## 2017-11-21 LAB — TROPONIN I: Troponin I: 16.11 ng/mL (ref <0.03)

## 2017-11-21 MED ORDER — PERFLUTREN LIPID MICROSPHERE
1.0000 mL | INTRAVENOUS | Status: DC | PRN
  Administered 2017-11-21: 2 mL via INTRAVENOUS
  Filled 2017-11-21: qty 10

## 2017-11-21 NOTE — Progress Notes (Signed)
Set up patient home CPAP equipment. Added distilled H20 per patient request. Patient does not wear O2 with CPAP. Currently on RA. Patient states he is able to place himself on/off as needed.

## 2017-11-21 NOTE — Progress Notes (Signed)
CARDIAC REHAB PHASE I   PRE:  Rate/Rhythm: 75 SR    BP: sitting 134/72    SaO2:   MODE:  Ambulation: 370 ft   POST:  Rate/Rhythm: 81 SR    BP: sitting 140/74     SaO2:   Tolerated well. No c/o. Ed completed with good reception. He plans to quit vaping. Gave him information. Understands importance of Brilinta but does not have the copay card yet. Will send referral to [facility]. Encouraged more walking this pm. [phone]   Harriet Massonandi Kristan Colin Ellers CES, ACSM 11/21/2017 2:25 PM

## 2017-11-21 NOTE — Progress Notes (Signed)
Progress Note  Patient Name: Luke [name] Date of Encounter: 11/21/2017  Primary Cardiologist: No primary care provider on file.   Subjective   Feels well this am. No chest pain or dyspnea. HA resolved with stopping Ntg.   Inpatient Medications    Scheduled Meds: . aspirin  81 mg Oral Daily  . atorvastatin  80 mg Oral q1800  . carvedilol  3.125 mg Oral BID WC  . enoxaparin (LOVENOX) injection  40 mg Subcutaneous Q24H  . lamoTRIgine  300 mg Oral QHS  . loratadine  10 mg Oral Daily  . sodium chloride flush  3 mL Intravenous Q12H  . ticagrelor  90 mg Oral BID   Continuous Infusions: . sodium chloride 10 mL/hr at 11/21/17 0900  . nitroGLYCERIN 25 mcg/min (11/21/17 0500)   PRN Meds: sodium chloride, acetaminophen, nitroGLYCERIN, ondansetron (ZOFRAN) IV, sodium chloride flush   Vital Signs    Vitals:   11/21/17 0600 11/21/17 0700 11/21/17 0800 11/21/17 0900  BP: 125/80 128/76 (!) 141/76 99/72  Pulse: 80 78 81 86  Resp: 10 20 13  (!) 23  Temp:  98 F (36.7 C)    TempSrc:  Oral    SpO2: 96% 95% 97% 96%  Weight:      Height:        Intake/Output Summary (Last 24 hours) at 11/21/2017 0919 Last data filed at 11/21/2017 0900 Gross per 24 hour  Intake 1061.03 ml  Output 1660 ml  Net -598.97 ml   Filed Weights   11/20/17 0852 11/20/17 1525  Weight: 274 lb (124.3 kg) 273 lb 9.5 oz (124.1 kg)    Telemetry    NSR - Personally Reviewed  ECG    NSR. T wave inversion 1 and aVL - Personally Reviewed  Physical Exam   GEN: No acute distress.   Neck: No JVD Cardiac: RRR, no murmurs, rubs, or gallops. Right radial site without hematoma. Respiratory: Clear to auscultation bilaterally. GI: Soft, nontender, non-distended  MS: No edema; No deformity. Neuro:  Nonfocal  Psych: Normal affect   Labs    Chemistry Recent Labs  Lab 11/20/17 1044 11/21/17 0253  NA 144 139  K 4.4 3.6  CL 108 100  CO2 29 26  GLUCOSE 163* 125*  BUN 14 13  CREATININE 0.99 1.07    CALCIUM 9.8 9.2  GFRNONAA >60 >60  GFRAA >60 >60  ANIONGAP 7 13     Hematology Recent Labs  Lab 11/20/17 1044 11/20/17 2013 11/21/17 0253  WBC 6.2  --  9.0  RBC 4.45  --  4.35  HGB 14.8  --  14.4  HCT 43.9  --  42.5  MCV 98.7  --  97.7  MCH 33.3  --  33.1  MCHC 33.7  --  33.9  RDW 13.0  --  12.6  PLT 205 187 196    Cardiac Enzymes Recent Labs  Lab 11/20/17 1603 11/20/17 2013 11/21/17 0253  TROPONINI 10.58* 16.63* 16.11*    Recent Labs  Lab 11/20/17 1052  TROPIPOC 0.66*     BNPNo results for input(s): BNP, PROBNP in the last 168 hours.   DDimer No results for input(s): DDIMER in the last 168 hours.   Radiology    Dg Chest 2 View  Result Date: 11/20/2017 CLINICAL DATA:  Central chest pain EXAM: CHEST - 2 VIEW COMPARISON:  None. FINDINGS: Cardiac shadow is within normal limits. The lungs are well aerated bilaterally with the exception of some mild lingular atelectasis. No pleural  effusion is seen. No bony abnormality is noted. IMPRESSION: Mild lingular atelectasis. Electronically Signed   By: Alcide CleverMark  Lukens M.D.   On: 11/20/2017 09:39    Cardiac Studies   Cardiac cath/PCI: Conclusions: 1. 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 There is also occlusion of the distal LAD, likely due to embolization from the proximal stenosis. 2. Mild luminal irregularities involving the LCx and RCA. 3. Moderately reduced LVEF with mid and apical anterior hypokinesis. Mildly to moderately elevated LVEDP. 4. Successful PCI to proximal LAD using Orsiro 3.5 x 22 mm drug eluting stent (post-dilated up to 4.5 mm) with 0% residual stenosis. Distal LAD occlusion was dottered with a balloon with some improvement in flow.  Recommendations: 1. Admit to ICU for post-PCI monitoring and titration of NTG infusion for relief of chest pain due to small vessel disease (embolization +/- microvascular dysfunction). 2. Tirofiban infusion x  18 hours. 3. Furosemide 40 mg IV x 1 given at end of case. Further diuresis may be needed based on urine output and volume status. 4. Aggressive secondary prevention, including high-intensity statin therapy. 5. Initiate carvedilol 3.125 mg BID; consider adding ACEI/ARB before discharge as renal function and blood pressure allow.  Recommend uninterrupted dual antiplatelet therapy with Aspirin 81mg  daily and Ticagrelor 90mg  twice daily for a minimum of 12 months (ACS - Class I recommendation).    Patient Profile     57 y.o. male with history of OSA and bipolar disorder presents with NSTEMI  Assessment & Plan    1. NSTEMI. Peak troponin 6.63. Cardiac cath findings of critical proximal LAD stenosis treated with DES. Distal embolization of thrombus. Received DAPT and Aggrastat overnight. Chest pain resolved. Ecg looks good. I reviewed Echo this am personally. It shows severe apical HK to AK with overall EF 45-50%. Will continue Coreg, high dose statin and DAPT with ASA and Brilinta. Transfer to telemetry today. Anticipate DC in am if stable.  2. OSA on CPAP. 3. Bipolar disorder. 4. Elevated BS. Will check A1c 5. History of hyperlipidemia. Was on fenofibrate in past for elevated triglycerides. Will check lipid panel. On high dose statin.  For questions or updates, please contact [facility] Please consult [email or web address] for contact info under Cardiology/STEMI.      Signed, Peter SwazilandJordan, MD  11/21/2017, 9:19 AM

## 2017-11-21 NOTE — Care Management (Signed)
11-21-17  BENEFITS CHECK :  #  9.  S/W    IVY G. @  [employer] # [id number]  BRILINTA  90 MG BID COVER- YES CO-PAY- ZERO DOLLARS TIER- NO PRIOR APPROVAL- NO  NO DEDUCTIBLE  PREFERRED PHARMACY : YES   [facility]

## 2017-11-21 NOTE — Plan of Care (Addendum)
Pt currently in bed, appears asleep. TR Band site level 0. Vital signs stable. Pt ambulates to bathroom, steady on feet and is independent with ADL's. No complaints of chest pain this shift. Seen by cardiac rehab this shift, see note. No new changes at this time. Transfer orders, awaiting Tele bed. Will continue to monitor.  Problem: Education: Goal: Knowledge of General Education information will improve Description Including pain rating scale, medication(s)/side effects and non-pharmacologic comfort measures Outcome: Progressing   Problem: Health Behavior/Discharge Planning: Goal: Ability to manage health-related needs will improve Outcome: Progressing   Problem: Clinical Measurements: Goal: Ability to maintain clinical measurements within normal limits will improve Outcome: Progressing Goal: Will remain free from infection Outcome: Progressing Goal: Diagnostic test results will improve Outcome: Progressing Goal: Respiratory complications will improve Outcome: Progressing Goal: Cardiovascular complication will be avoided Outcome: Progressing   Problem: Activity: Goal: Risk for activity intolerance will decrease Outcome: Progressing   Problem: Nutrition: Goal: Adequate nutrition will be maintained Outcome: Progressing   Problem: Coping: Goal: Level of anxiety will decrease Outcome: Progressing   Problem: Elimination: Goal: Will not experience complications related to bowel motility Outcome: Progressing Goal: Will not experience complications related to urinary retention Outcome: Progressing   Problem: Pain Managment: Goal: General experience of comfort will improve Outcome: Progressing   Problem: Safety: Goal: Ability to remain free from injury will improve Outcome: Progressing   Problem: Skin Integrity: Goal: Risk for impaired skin integrity will decrease Outcome: Progressing

## 2017-11-21 NOTE — Progress Notes (Signed)
  Echocardiogram 2D Echocardiogram has been performed.  Pieter PartridgeBrooke S Cheray Pardi 11/21/2017, 8:51 AM

## 2017-11-22 DIAGNOSIS — Z720 Tobacco use: Secondary | ICD-10-CM

## 2017-11-22 DIAGNOSIS — E781 Pure hyperglyceridemia: Secondary | ICD-10-CM

## 2017-11-22 DIAGNOSIS — Z955 Presence of coronary angioplasty implant and graft: Secondary | ICD-10-CM

## 2017-11-22 DIAGNOSIS — E785 Hyperlipidemia, unspecified: Secondary | ICD-10-CM

## 2017-11-22 MED ORDER — TICAGRELOR 90 MG PO TABS: 90.0000 mg | ORAL_TABLET | Freq: Two times a day (BID) | ORAL | 2 refills | Status: DC

## 2017-11-22 MED ORDER — NITROGLYCERIN 0.4 MG SL SUBL: 0.4000 mg | SUBLINGUAL_TABLET | SUBLINGUAL | 2 refills | Status: AC | PRN

## 2017-11-22 MED ORDER — ASPIRIN 81 MG PO CHEW
81.0000 mg | CHEWABLE_TABLET | Freq: Every day | ORAL | Status: AC
Start: 2017-11-23 — End: ?

## 2017-11-22 MED ORDER — CARVEDILOL 3.125 MG PO TABS: 3.1250 mg | ORAL_TABLET | Freq: Two times a day (BID) | ORAL | 2 refills | Status: DC

## 2017-11-22 MED ORDER — ATORVASTATIN CALCIUM 80 MG PO TABS: 80.0000 mg | ORAL_TABLET | Freq: Every day | ORAL | 1 refills | Status: DC

## 2017-11-22 NOTE — Progress Notes (Signed)
Brilinta coupon card given to patient with explanation of usage; B Cheyenne Bordeaux RN,MHA,BSN [phone] 

## 2017-11-22 NOTE — Progress Notes (Signed)
Discharge completed by Mel, SWOT RN. Pt is waiting on work note from MD. Pt is not in distress. Pt belongings with pt. Pt's wife will drive him home.

## 2017-11-22 NOTE — Discharge Summary (Signed)
Discharge Summary    Patient ID: Luke [name],  MRN: [id number], DOB/AGE: 1960/04/23 56 y.o.  Admit date: 11/20/2017 Discharge date: 11/22/2017  Primary Care Provider: Clovis Riley, L.Dean Primary Cardiologist: Dr. Mayford Knife   Discharge Diagnoses    Active Problems:   NSTEMI (non-ST elevated myocardial infarction) [facility])   Non-ST elevation (NSTEMI) myocardial infarction [facility])   Hypertriglyceridemia   Tobacco use   Status post coronary artery stent placement   Allergies Allergies  Allergen Reactions  . Penicillins     Has patient had a PCN reaction causing immediate rash, facial/tongue/throat swelling, SOB or lightheadedness with hypotension: Unknown Has patient had a PCN reaction causing severe rash involving mucus membranes or skin necrosis: Unknown Has patient had a PCN reaction that required hospitalization: Unknown Patient had a PCN reaction occurring within the last 10 years: No If all of the above answers are "NO", then may proceed with Cephalosporin use.     Diagnostic Studies/Procedures    Cath: 11/20/17  Conclusions: 1. 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 There is also occlusion of the distal LAD, likely due to embolization from the proximal stenosis. 2. Mild luminal irregularities involving the LCx and RCA. 3. Moderately reduced LVEF with mid and apical anterior hypokinesis. Mildly to moderately elevated LVEDP. 4. Successful PCI to proximal LAD using Orsiro 3.5 x 22 mm drug eluting stent (post-dilated up to 4.5 mm) with 0% residual stenosis. Distal LAD occlusion was dottered with a balloon with some improvement in flow.  Recommendations: 1. Admit to ICU for post-PCI monitoring and titration of NTG infusion for relief of chest pain due to small vessel disease (embolization +/- microvascular dysfunction). 2. Tirofiban infusion x 18 hours. 3. Furosemide 40 mg IV x 1 given at end of case. Further  diuresis may be needed based on urine output and volume status. 4. Aggressive secondary prevention, including high-intensity statin therapy. 5. Initiate carvedilol 3.125 mg BID; consider adding ACEI/ARB before discharge as renal function and blood pressure allow.  Recommend uninterrupted dual antiplatelet therapy with Aspirin 81mg  daily and Ticagrelor 90mg  twice daily for a minimum of 12 months (ACS - Class I recommendation).   Yvonne Kendall, MD [facility] Pager: [phone]  TTE: 11/21/17  Study Conclusions  - Left ventricle: The cavity size was normal. Wall thickness was   increased in a pattern of mild LVH. The estimated ejection   fraction was 55%. Akinesis of the apical septum and the true   apex. Doppler parameters are consistent with abnormal left   ventricular relaxation (grade 1 diastolic dysfunction). - Aortic valve: There was no stenosis. - Mitral valve: Mildly calcified annulus. There was no significant   regurgitation. - Right ventricle: The cavity size was normal. Systolic function   was normal. - Tricuspid valve: Peak RV-RA gradient (S): 22 mm Hg. - Pulmonary arteries: PA peak pressure: 25 mm Hg (S). - Inferior vena cava: The vessel was normal in size. The   respirophasic diameter changes were in the normal range (>= 50%),   consistent with normal central venous pressure.  Impressions:  - Normal LV size with mild LV hypertrophy. EF 55%. Akinesis of the   apical septum and the true apex. Normal RV size and systolic   function. No significant valvular abnormalities. _____________   History of Present Illness     57 yo male with PMH of OSA on CPAP, obesity, and Bipolar. Reports he is followed by his  PCP as a outpatient and had a normal physical/labs recently. He has been seen by Dr. Mayford Knifeurner for his OSA and is on CPAP. Denies any family hx of CAD. He is not a smoker, but vapes. States about a week ago he was awoken from his sleep with centralized chest  pain with radiation down the left arm that lasted about 30 minutes and resolved. No recurrence until this morning. States he was eating breakfast around 7:30am when he developed centralized chest pain, no radiation that was actually more intense than what he experienced the week before. Symptoms persisted and [facility]   In the ED his labs showed stable electrolytes, Cr 0.99, Trop 0.66, Hgb 14.8, INR 0.99. EKG showed SR with slight ST changes in the anterior leads. He was placed on IV heparin, and IV nitro with improvement in his chest pain. Case discussed with Dr. Eldridge DaceVaranasi who agreed to transfer for cardiac cath. On arrival he was still having 5/10 chest pain.   Hospital Course     Underwent cardiac cath noted above with findings of critical proximal LAD stenosis treated with DES. Distal embolization of thrombus. Received DAPT and Aggrastat post cath. Chest pain resolved. Trop peaked at 16.63. LDL noted at 51, Trig 252 and placed on high dose statin. Low dose coreg added post cath and blood pressures tolerated. Follow up echo showed EF of 55% with akinesis in the septum and apex, along with G1DD. He worked well with cardiac rehab without recurrent chest pain. Counseled on the need for vape cessation.   General: Well developed, well nourished, male appearing in no acute distress. Head: Normocephalic, atraumatic.  Neck: Supple without bruits, JVD. Lungs:  Resp regular and unlabored, CTA. Heart: RRR, S1, S2, no S3, S4, or murmur; no rub. Abdomen: Soft, non-tender, non-distended with normoactive bowel sounds. No hepatomegaly. No rebound/guarding. No obvious abdominal masses. Extremities: No clubbing, cyanosis, edema. Distal pedal pulses are 2+ bilaterally. R radial cath site stable without bruising or hematoma Neuro: Alert and oriented X 3. Moves all extremities spontaneously. Psych: Normal affect.  Luke [name] was seen by Dr. [place] and determined stable for discharge home.  Follow up in the office has been arranged. Medications are listed below.   _____________  Discharge Vitals Blood pressure 122/82, pulse 65, temperature 97.7 F (36.5 C), temperature source Oral, resp. rate 18, height 5\' 9"  (1.753 m), weight 122.4 kg, SpO2 95 %.  Filed Weights   11/20/17 1525 11/21/17 1818 11/22/17 0625  Weight: 124.1 kg 123.7 kg 122.4 kg    Labs & Radiologic Studies    CBC Recent Labs    11/20/17 1044 11/20/17 2013 11/21/17 0253  WBC 6.2  --  9.0  HGB 14.8  --  14.4  HCT 43.9  --  42.5  MCV 98.7  --  97.7  PLT 205 187 196   Basic Metabolic Panel Recent Labs    40/98/1106/10/04 1044 11/21/17 0253  NA 144 139  K 4.4 3.6  CL 108 100  CO2 29 26  GLUCOSE 163* 125*  BUN 14 13  CREATININE 0.99 1.07  CALCIUM 9.8 9.2   Liver Function Tests No results for input(s): AST, ALT, ALKPHOS, BILITOT, PROT, ALBUMIN in the last 72 hours. No results for input(s): LIPASE, AMYLASE in the last 72 hours. Cardiac Enzymes Recent Labs    11/20/17 1603 11/20/17 2013 11/21/17 0253  TROPONINI 10.58* 16.63* 16.11*   BNP Invalid input(s): POCBNP D-Dimer No results for input(s): DDIMER in  the last 72 hours. Hemoglobin A1C Recent Labs    11/21/17 0941  HGBA1C 5.5   Fasting Lipid Panel Recent Labs    11/21/17 0941  CHOL 131  HDL 30*  LDLCALC 51  TRIG 161*  CHOLHDL 4.4   Thyroid Function Tests No results for input(s): TSH, T4TOTAL, T3FREE, THYROIDAB in the last 72 hours.  Invalid input(s): FREET3 _____________  Dg Chest 2 View  Result Date: 11/20/2017 CLINICAL DATA:  Central chest pain EXAM: CHEST - 2 VIEW COMPARISON:  None. FINDINGS: Cardiac shadow is within normal limits. The lungs are well aerated bilaterally with the exception of some mild lingular atelectasis. No pleural effusion is seen. No bony abnormality is noted. IMPRESSION: Mild lingular atelectasis. Electronically Signed   By: Alcide Clever M.D.   On: 11/20/2017 09:39   Disposition   Pt is being  discharged home today in good condition.  Follow-up Plans & Appointments    Follow-up Information    Clovis Riley, L.August Saucer, MD.   Specialty:  Family Medicine Contact information: [address] [place] [address] [phone]        Leone Brand, NP Follow up on 12/06/2017.   Specialties:  Cardiology, Radiology Why:  at 11am for your follow up appt Contact information: [address] [place] [address] [phone]          Discharge Instructions    Amb Referral to Cardiac Rehabilitation   Complete by:  As directed    Diagnosis:   Coronary Stents NSTEMI PTCA     Call MD for:  redness, tenderness, or signs of infection (pain, swelling, redness, odor or green/yellow discharge around incision site)   Complete by:  As directed    Diet - low sodium heart healthy   Complete by:  As directed    Discharge instructions   Complete by:  As directed    Radial Site Care Refer to this sheet in the next few weeks. These instructions provide you with information on caring for yourself after your procedure. Your caregiver may also give you more specific instructions. Your treatment has been planned according to current medical practices, but problems sometimes occur. Call your caregiver if you have any problems or questions after your procedure. HOME CARE INSTRUCTIONS You may shower the day after the procedure.Remove the bandage (dressing) and gently wash the site with plain soap and water.Gently pat the site dry.  Do not apply powder or lotion to the site.  Do not submerge the affected site in water for 3 to 5 days.  Inspect the site at least twice daily.  Do not flex or bend the affected arm for 24 hours.  No lifting over 5 pounds (2.3 kg) for 5 days after your procedure.  Do not drive home if you are discharged the same day of the procedure. Have someone else drive you.  You may drive 24 hours after the procedure unless otherwise instructed by your  caregiver.  What to expect: Any bruising will usually fade within 1 to 2 weeks.  Blood that collects in the tissue (hematoma) may be painful to the touch. It should usually decrease in size and tenderness within 1 to 2 weeks.  SEEK IMMEDIATE MEDICAL CARE IF: You have unusual pain at the radial site.  You have redness, warmth, swelling, or pain at the radial site.  You have drainage (other than a small amount of blood on the dressing).  You have chills.  You have a fever  or persistent symptoms for more than 72 hours.  You have a fever and your symptoms suddenly get worse.  Your arm becomes pale, cool, tingly, or numb.  You have heavy bleeding from the site. Hold pressure on the site.   PLEASE DO NOT MISS ANY DOSES OF YOUR BRILINTA!!!!! Also keep a log of you blood pressures and bring back to your follow up appt. Please call the office with any questions.   Patients taking blood thinners should generally stay away from medicines like ibuprofen, Advil, Motrin, naproxen, and Aleve due to risk of stomach bleeding. You may take Tylenol as directed or talk to your primary doctor about alternatives.   Increase activity slowly   Complete by:  As directed       Discharge Medications     Medication List    STOP taking these medications   cyclobenzaprine 10 MG tablet Commonly known as:  FLEXERIL   HYDROcodone-acetaminophen 5-325 MG tablet Commonly known as:  NORCO/VICODIN   naproxen 500 MG tablet Commonly known as:  NAPROSYN     TAKE these medications   aspirin 81 MG chewable tablet Chew 1 tablet (81 mg total) by mouth daily. Start taking on:  11/23/2017   atorvastatin 80 MG tablet Commonly known as:  LIPITOR Take 1 tablet (80 mg total) by mouth daily at 6 PM.   carvedilol 3.125 MG tablet Commonly known as:  COREG Take 1 tablet (3.125 mg total) by mouth 2 (two) times daily with a meal.   cetirizine 10 MG tablet Commonly known as:  ZYRTEC Take 1 tablet (10 mg total) by mouth  daily.   FISH OIL PO Take 1 tablet by mouth daily.   lamoTRIgine 150 MG tablet Commonly known as:  LAMICTAL Take 2 tablets (300 mg total) by mouth daily. Please only use generic from manufacturer pt has been on prior OR brand name. Do NOT USE GENERIC FROM UNICHEM - MEDICALLY NECESSARY   multivitamin capsule Take 1 capsule by mouth daily.   nitroGLYCERIN 0.4 MG SL tablet Commonly known as:  NITROSTAT Place 1 tablet (0.4 mg total) under the tongue every 5 (five) minutes as needed for chest pain.   ticagrelor 90 MG Tabs tablet Commonly known as:  BRILINTA Take 1 tablet (90 mg total) by mouth 2 (two) times daily.   Vitamin D-3 1000 units Caps Take 1 capsule by mouth daily.        Acute coronary syndrome (MI, NSTEMI, STEMI, etc) this admission?: Yes.     AHA/ACC Clinical Performance & Quality Measures: 6. Aspirin prescribed? - Yes 7. ADP Receptor Inhibitor (Plavix/Clopidogrel, Brilinta/Ticagrelor or Effient/Prasugrel) prescribed (includes medically managed patients)? - Yes 8. Beta Blocker prescribed? - Yes 9. High Intensity Statin (Lipitor 40-80mg  or Crestor 20-40mg ) prescribed? - Yes 10. EF assessed during THIS hospitalization? - Yes 11. For EF <40%, was ACEI/ARB prescribed? - Not Applicable (EF >/= 40%) 12. For EF <40%, Aldosterone Antagonist (Spironolactone or Eplerenone) prescribed? - N/A 13. Cardiac Rehab Phase [name] ordered (Included Medically managed Patients)? - Yes   Outstanding Labs/Studies   FLP/LFTs in 6 weeks.   Duration of Discharge Encounter   Greater than 30 minutes including physician time.  Signed, Laverda Page NP-C 11/22/2017, 10:56 AM

## 2017-11-22 NOTE — Progress Notes (Signed)
Pt discharged via wheelchair with volunteer services  Pt has discharge letter from NP

## 2017-11-22 NOTE — Progress Notes (Signed)
CARDIAC REHAB PHASE I   PRE:  Rate/Rhythm: 92 SR    BP: sitting 132/75    SaO2:   MODE:  Ambulation: 790 ft   POST:  Rate/Rhythm: 99 SR    BP: sitting 138/93     SaO2:   Tolerated well, feels good. All questions answered.  [id number]   Harriet MassonRandi Kristan Zacari Stiff CES, ACSM 11/22/2017 11:04 AM

## 2017-11-23 ENCOUNTER — Telehealth (HOSPITAL_COMMUNITY): Payer: Self-pay

## 2017-11-23 NOTE — Telephone Encounter (Signed)
Made initial call to patient in regards to Cardiac Rehab. Patient stated he is interested in the program. Explained scheduling process with patient and went over insurance, patient verbalized understanding. Will contact patient for scheduling once f/u appt has been completed.

## 2017-11-23 NOTE — Telephone Encounter (Signed)
Patients insurance is active and benefits verified through [facility] - No co-pay, deductible amount of $6,650/$1,831.06 has been met, out of pocket amount of $6,650/$1,831.06 has been met, no co-insurance, and no pre-authorization is required. Passport/reference [phone] Patient will need to satisfy deductible before being covered at 100%  Will contact patient to see if he is interested in the Cardiac Rehab Program. If interested, patient will need to complete follow up appt. Once completed, patient will be contacted for scheduling.

## 2017-12-06 ENCOUNTER — Ambulatory Visit: Payer: 59 | Admitting: Cardiology

## 2017-12-11 ENCOUNTER — Encounter: Payer: Self-pay | Admitting: Cardiology

## 2018-01-01 ENCOUNTER — Encounter: Payer: Self-pay | Admitting: Cardiology

## 2018-01-01 ENCOUNTER — Ambulatory Visit (INDEPENDENT_AMBULATORY_CARE_PROVIDER_SITE_OTHER): Payer: 59 | Admitting: Cardiology

## 2018-01-01 VITALS — BP 124/78 | HR 78

## 2018-01-01 DIAGNOSIS — I2510 Atherosclerotic heart disease of native coronary artery without angina pectoris: Secondary | ICD-10-CM

## 2018-01-01 DIAGNOSIS — I214 Non-ST elevation (NSTEMI) myocardial infarction: Secondary | ICD-10-CM | POA: Diagnosis not present

## 2018-01-01 DIAGNOSIS — E781 Pure hyperglyceridemia: Secondary | ICD-10-CM

## 2018-01-01 DIAGNOSIS — Z720 Tobacco use: Secondary | ICD-10-CM

## 2018-01-01 NOTE — Progress Notes (Signed)
Cardiology Office Note   Date:  01/01/2018   ID:  Luke [name], DOB 22-Nov-1960, MRN [id number]  PCP:  Asencion Gowda.August Saucer, MD  Cardiologist:  Dr. Mayford Knife     Chief Complaint  Patient presents with  . Hospitalization Follow-up    NSTEMI      History of Present Illness: Luke [name] is a 57 y.o. male who presents for post hospitalization for NSTEMI, CAD with stent to LAD and PTCA to dLAD with some improvement in flow.     Recommend uninterrupted dual antiplatelet therapy with Aspirin 81mg  daily and Ticagrelor 90mg  twice dailyfor a minimum of 12 months (ACS - Class I recommendation).  Pt with hx of OSA on CPAP, obesity and bipolar disease, with chest pain 11/20/17 and troponin elevated.  He still had pain on admit and went to cath lab on IV hep. And IV NTG.  Pk troponin 16.63.  EF 55%, G1DD.    Since discharge he has not missed any medication  No chest pain and no SOB.  He does plan to go to Cardiac rehab.thought his work schedule may be an issue.  He has gone back to work and has no problems.   No FH of CAD.  He was vaping but has stopped.  He is eating healthy and walking 1.4 miles per day after work.       Past Medical History:  Diagnosis Date  . Allergy   . Bipolar disorder (HCC)   . Obesity   . OSA (obstructive sleep apnea)    mild OSA with AHI 15.2/hr on CPAP at 12cm H2O - Dr. Mayford Knife    Past Surgical History:  Procedure Laterality Date  . CORONARY STENT INTERVENTION N/A 11/20/2017   Procedure: CORONARY STENT INTERVENTION;  Surgeon: Yvonne Kendall, MD;  Location: [facility] INVASIVE CV LAB;  Service: Cardiovascular;  Laterality: N/A;  LAD   . LEFT HEART CATH AND CORONARY ANGIOGRAPHY N/A 11/20/2017   Procedure: LEFT HEART CATH AND CORONARY ANGIOGRAPHY;  Surgeon: Yvonne Kendall, MD;  Location: [facility] INVASIVE CV LAB;  Service: Cardiovascular;  Laterality: N/A;     Current Outpatient Medications  Medication Sig Dispense Refill  . aspirin 81 MG chewable tablet Chew 1 tablet  (81 mg total) by mouth daily.    Marland Kitchen atorvastatin (LIPITOR) 80 MG tablet Take 1 tablet (80 mg total) by mouth daily at 6 PM. 90 tablet 1  . carvedilol (COREG) 3.125 MG tablet Take 1 tablet (3.125 mg total) by mouth 2 (two) times daily with a meal. 60 tablet 2  . cetirizine (ZYRTEC) 10 MG tablet Take 1 tablet (10 mg total) by mouth daily. 30 tablet 1  . Cholecalciferol (VITAMIN D-3) 1000 UNITS CAPS Take 1 capsule by mouth daily.     Marland Kitchen lamoTRIgine (LAMICTAL) 150 MG tablet Take 2 tablets (300 mg total) by mouth daily. Please only use generic from manufacturer pt has been on prior OR brand name. Do NOT USE GENERIC FROM UNICHEM - MEDICALLY NECESSARY 60 tablet 1  . Multiple Vitamin (MULTIVITAMIN) capsule Take 1 capsule by mouth daily.    . nitroGLYCERIN (NITROSTAT) 0.4 MG SL tablet Place 1 tablet (0.4 mg total) under the tongue every 5 (five) minutes as needed for chest pain. 25 tablet 2  . Omega-3 Fatty Acids (FISH OIL PO) Take 1 tablet by mouth daily.    . ticagrelor (BRILINTA) 90 MG TABS tablet Take 1 tablet (90 mg total) by mouth 2 (two) times daily. 180 tablet 2  No current facility-administered medications for this visit.     Allergies:   Penicillins    Social History:  The patient  reports that he has been smoking cigarettes. He has never used smokeless tobacco. He reports that he drinks about 3.0 - 4.0 standard drinks of alcohol per week. He reports that he does not use drugs.   Family History:  The patient's family history includes CVA in his father and mother; Cancer in his father; Diabetes Mellitus [name] in his sister; Hypertension in his father; Mental illness in his brother.    ROS:  General:no colds or fevers, no weight changes Skin:no rashes or ulcers HEENT:no blurred vision, no congestion CV:see HPI PUL:see HPI GI:no diarrhea constipation or melena, no indigestion GU:no hematuria, no dysuria MS:no joint pain, no claudication Neuro:no syncope, no lightheadedness Endo:no diabetes,  no thyroid disease  Wt Readings from Last 3 Encounters:  11/22/17 269 lb 12.8 oz (122.4 kg)  03/19/15 278 lb 12.8 oz (126.5 kg)  04/07/13 276 lb (125.2 kg)     PHYSICAL EXAM: VS:  BP 124/78   Pulse 78  , BMI There is no height or weight on file to calculate BMI. General:Pleasant affect, NAD Skin:Warm and dry, brisk capillary refill HEENT:normocephalic, sclera clear, mucus membranes moist Neck:supple, no JVD, no bruits  Heart:S1S2 RRR without murmur, gallup, rub or click Lungs:clear without rales, rhonchi, or wheezes ZOX:WRUE, non tender, + BS, do not palpate liver spleen or masses Ext:no lower ext edema, 2+ pedal pulses, 2+ radial pulses Neuro:alert and oriented X 3, MAE, follows commands, + facial symmetry    EKG:  EKG is ordered today. The ekg ordered today demonstrates SR T wave inversion V3-V 6 due tot recent MI.     Recent Labs: 11/21/2017: BUN 13; Creatinine, Ser 1.07; Hemoglobin 14.4; Platelets 196; Potassium 3.6; Sodium 139    Lipid Panel    Component Value Date/Time   CHOL 131 11/21/2017 0941   TRIG 252 (H) 11/21/2017 0941   HDL 30 (L) 11/21/2017 0941   CHOLHDL 4.4 11/21/2017 0941   VLDL 50 (H) 11/21/2017 0941   LDLCALC 51 11/21/2017 0941       Other studies Reviewed: Additional studies/ records that were reviewed today include: . Cardiac cath 11/20/17 FINDINGS: 1. 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  There is also occlusion of the distal LAD, likely due to embolization from the proximal stenosis. 2. No significant CAD involving LCx or RCA. 3. Moderately reduced LVEF with mid and apical anterior hypokinesis.  MIldly to moderately elevated LVEDP. 4. Successful PCI to proximal LAD using Orsiro 3.5 x 22 mm drug eluting stent with 0% residual stenosis.  Distal LAD occlusion was Dottered with a balloon with improvement in flow.  RECOMMENDATIONS: 1. Admit to ICU for post-PCI monitoring and titration of  NTG infusion for relief of chest pain due to small vessel disease (embolization +/- microvascular dysfunction). 2. Tirofiban infusion x 18 hours. 3. DAPT with ASA and ticagrelor for at least 12 months. 4. Furosemide 40 mg IV x 1 given at end of case.  May need to be redosed based on urine output.   ASSESSMENT AND PLAN:  1.  CAD with NSTEMI and stent to LAD, D1. Continue ASA and brilinta for 12 months.  Continue BB and statin. Follow up in 6-8 weeks with Dr. Mayford Knife. 2.  HLD continue statin high dose recheck hepatic and lipid in 6 weeks.  3.   Tobacco  use has stopped vaping.   4.  osa followed by Dr. Mayford Knifeurner   Current medicines are reviewed with the patient today.  The patient Has no concerns regarding medicines.  The following changes have been made:  See above Labs/ tests ordered today include:see above  Disposition:   FU:  see above  Signed, Nada BoozerLaura Amandine Covino, NP  01/01/2018 9:56 AM    [facility] [address], [place], [place]  [address]/ [address] [place], [place] Phone: [phone]; Fax: [phone]  [phone]

## 2018-01-01 NOTE — Patient Instructions (Signed)
Medication Instructions:  Your physician recommends that you continue on your current medications as directed. Please refer to the Current Medication list given to you today.   Labwork: 6 WEEKS 02/12/18:  FASTING LIPID AND LFT  Testing/Procedures: None ordered   Follow-Up: Your physician recommends that you schedule a follow-up appointment in: 8 WEEKS WITH DR. Mayford KnifeURNER   Any Other Special Instructions Will Be Listed Below (If Applicable).     If you need a refill on your cardiac medications before your next appointment, please call your pharmacy.

## 2018-01-02 ENCOUNTER — Telehealth (HOSPITAL_COMMUNITY): Payer: Self-pay

## 2018-01-02 NOTE — Telephone Encounter (Signed)
Attempted to contact patient in regards to Cardiac Rehab - lm on vm °

## 2018-01-09 ENCOUNTER — Telehealth (HOSPITAL_COMMUNITY): Payer: Self-pay

## 2018-01-09 NOTE — Telephone Encounter (Signed)
Called and spoke with patient in regards to Cardiac Rehab - Patient stated he wants to participate but he cannot commit to 2-3 days a week due to work schedule. He said he may be moving to [facility] and will not be participating. Closed referral.

## 2018-02-12 ENCOUNTER — Other Ambulatory Visit: Payer: 59

## 2018-02-18 ENCOUNTER — Other Ambulatory Visit: Payer: 59 | Admitting: *Deleted

## 2018-02-18 ENCOUNTER — Encounter (INDEPENDENT_AMBULATORY_CARE_PROVIDER_SITE_OTHER): Payer: Self-pay

## 2018-02-18 DIAGNOSIS — I214 Non-ST elevation (NSTEMI) myocardial infarction: Secondary | ICD-10-CM

## 2018-02-18 DIAGNOSIS — E781 Pure hyperglyceridemia: Secondary | ICD-10-CM

## 2018-02-18 LAB — LIPID PANEL
CHOLESTEROL TOTAL: 107 mg/dL (ref 100–199)
Chol/HDL Ratio: 2.9 ratio (ref 0.0–5.0)
HDL: 37 mg/dL — ABNORMAL LOW (ref >39)
LDL Calculated: 40 mg/dL (ref 0–99)
TRIGLYCERIDES: 151 mg/dL — AB (ref 0–149)
VLDL Cholesterol Cal: 30 mg/dL (ref 5–40)

## 2018-02-18 LAB — HEPATIC FUNCTION PANEL
ALK PHOS: 110 IU/L (ref 39–117)
ALT: 38 IU/L (ref 0–44)
AST: 21 IU/L (ref 0–40)
Albumin: 4.2 g/dL (ref 3.5–5.5)
Bilirubin Total: 0.4 mg/dL (ref 0.0–1.2)
Bilirubin, Direct: 0.12 mg/dL (ref 0.00–0.40)
Total Protein: 6.2 g/dL (ref 6.0–8.5)

## 2018-02-26 ENCOUNTER — Telehealth: Payer: Self-pay | Admitting: *Deleted

## 2018-02-26 NOTE — Telephone Encounter (Signed)
Called pt re: lab results, left a message for pt to call back.  

## 2018-02-26 NOTE — Telephone Encounter (Signed)
-----   Message from Leone BrandLaura R Ingold, NP sent at 02/26/2018  3:30 PM EST ----- Hepatic levels normal , good cholesterol is low but improved and LDL is great at 40.  TG are still elevated but much improved.

## 2018-02-27 NOTE — Telephone Encounter (Signed)
Pt has been made aware of his lab results and he verbalized understanding.  

## 2018-03-11 ENCOUNTER — Ambulatory Visit (INDEPENDENT_AMBULATORY_CARE_PROVIDER_SITE_OTHER): Payer: 59 | Admitting: Cardiology

## 2018-03-11 ENCOUNTER — Encounter: Payer: Self-pay | Admitting: Cardiology

## 2018-03-11 VITALS — BP 132/82 | HR 101 | Ht 69.0 in | Wt 275.2 lb

## 2018-03-11 DIAGNOSIS — G4733 Obstructive sleep apnea (adult) (pediatric): Secondary | ICD-10-CM | POA: Diagnosis not present

## 2018-03-11 DIAGNOSIS — E785 Hyperlipidemia, unspecified: Secondary | ICD-10-CM | POA: Diagnosis not present

## 2018-03-11 DIAGNOSIS — I2510 Atherosclerotic heart disease of native coronary artery without angina pectoris: Secondary | ICD-10-CM | POA: Diagnosis not present

## 2018-03-11 HISTORY — DX: Atherosclerotic heart disease of native coronary artery without angina pectoris: I25.10

## 2018-03-11 MED ORDER — CARVEDILOL 3.125 MG PO TABS: 3.1250 mg | ORAL_TABLET | Freq: Two times a day (BID) | ORAL | 3 refills | Status: DC

## 2018-03-11 NOTE — Patient Instructions (Signed)
Medication Instructions:  Carvedilol 3.125 mg, twice a day, by mouth  If you need a refill on your cardiac medications before your next appointment, please call your pharmacy.   Lab work:  If you have labs (blood work) drawn today and your tests are completely normal, you will receive your results only by: Marland Kitchen. MyChart Message (if you have MyChart) OR . A paper copy in the mail If you have any lab test that is abnormal or we need to change your treatment, we will call you to review the results.  Testing/Procedures: Your physician has requested that you have an echocardiogram. Echocardiography is a painless test that uses sound waves to create images of your heart. It provides your doctor with information about the size and shape of your heart and how well your heart's chambers and valves are working. This procedure takes approximately one hour. There are no restrictions for this procedure.  Follow-Up: At [facility], you and your health needs are our priority.  As part of our continuing mission to provide you with exceptional heart care, we have created designated Provider Care Teams.  These Care Teams include your primary Cardiologist (physician) and Advanced Practice Providers (APPs -  Physician Assistants and Nurse Practitioners) who all work together to provide you with the care you need, when you need it.  Your physician wants you to follow-up in: 6 months with PA.    You will need a follow up appointment in 1 years.  Please call our office 2 months in advance to schedule this appointment.  You may see Armanda Magicraci Turner, MD or one of the following Advanced Practice Providers on your designated Care Team:   [place] Ranch[place] VelaBrittainy Simmons, PA-C Ronie Spiesayna Dunn, PA-C . Jacolyn ReedyMichele Lenze, PA-C

## 2018-03-11 NOTE — Progress Notes (Signed)
Cardiology Office Note:    Date:  03/11/2018   ID:  Charlies Silvers II, DOB 1960-12-21, MRN [id number]  PCP:  Asencion Gowda.August Saucer, MD  Cardiologist:  Armanda Magic, MD    Referring MD: Clovis Riley, Elbert Ewings.August Saucer, MD   Chief Complaint  Patient presents with  . Coronary Artery Disease  . Hyperlipidemia  . Sleep Apnea    History of Present Illness:    NARVEL KOZUB II is a 57 y.o. 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  He is on DAPT with aspirin and ticagrelor.  He also has a history of obstructive sleep apnea that was mild to moderate with an AHI of 15.2/h and is on CPAP at 12 cm H2O.  He is here today for followup and is doing well.  He denies any chest pain or pressure, SOB, DOE, PND, orthopnea, LE edema, dizziness, palpitations or syncope. He is compliant with his meds and is tolerating meds with no SE.  He is doing well with his CPAP device.  He tolerates the mask and feels the pressure is adequate.  Since going on CPAP he feels rested in the am and has no significant daytime sleepiness.  He denies any significant mouth or nasal dryness or nasal congestion.  He does not think that he snores.     Past Medical History:  Diagnosis Date  . Allergy   . Bipolar disorder (HCC)   . CAD (coronary artery disease), native coronary artery 03/11/2018  . Obesity   . OSA (obstructive sleep apnea)    mild OSA with AHI 15.2/hr on CPAP at 12cm H2O - Dr. Mayford Knife    Past Surgical History:  Procedure Laterality Date  . CORONARY STENT INTERVENTION N/A 11/20/2017   Procedure: CORONARY STENT INTERVENTION;  Surgeon: Yvonne Kendall, MD;  Location: [facility];  Service: Cardiovascular;  Laterality: N/A;  LAD   . LEFT HEART CATH AND CORONARY ANGIOGRAPHY N/A 11/20/2017   Procedure: LEFT HEART CATH AND CORONARY ANGIOGRAPHY;  Surgeon: Yvonne Kendall, MD;  Location: [facility];  Service: Cardiovascular;  Laterality: N/A;    Current Medications: Current Meds    Medication Sig  . aspirin 81 MG chewable tablet Chew 1 tablet (81 mg total) by mouth daily.  Marland Kitchen atorvastatin (LIPITOR) 80 MG tablet Take 1 tablet (80 mg total) by mouth daily at 6 PM.  . Cholecalciferol (VITAMIN D-3) 1000 UNITS CAPS Take 1 capsule by mouth daily.   Marland Kitchen lamoTRIgine (LAMICTAL) 150 MG tablet Take 2 tablets (300 mg total) by mouth daily. Please only use generic from manufacturer pt has been on prior OR brand name. Do NOT USE GENERIC FROM UNICHEM - MEDICALLY NECESSARY  . Multiple Vitamin (MULTIVITAMIN) capsule Take 1 capsule by mouth daily.  . nitroGLYCERIN (NITROSTAT) 0.4 MG SL tablet Place 1 tablet (0.4 mg total) under the tongue every 5 (five) minutes as needed for chest pain.  . Omega-3 Fatty Acids (FISH OIL PO) Take 1 tablet by mouth daily.  . ticagrelor (BRILINTA) 90 MG TABS tablet Take 1 tablet (90 mg total) by mouth 2 (two) times daily.     Allergies:   Penicillins   Social History   Socioeconomic History  . Marital status: Married    Spouse name: Not on file  . Number of children: Not on file  . Years of education: Not on file  . Highest education level: Not on file  Occupational History  . Not  on file  Social Needs  . Financial resource strain: Not on file  . Food insecurity:    Worry: Not on file    Inability: Not on file  . Transportation needs:    Medical: Not on file    Non-medical: Not on file  Tobacco Use  . Smoking status: Former Smoker    Types: Cigarettes    Last attempt to quit: 12/16/2004    Years since quitting: 13.2  . Smokeless tobacco: Never Used  Substance and Sexual Activity  . Alcohol use: Yes    Alcohol/week: 3.0 - 4.0 standard drinks    Types: 3 - 4 Standard drinks or equivalent per week  . Drug use: No  . Sexual activity: Not on file  Lifestyle  . Physical activity:    Days per week: Not on file    Minutes per session: Not on file  . Stress: Not on file  Relationships  . Social connections:    Talks on phone: Not on file     Gets together: Not on file    Attends religious service: Not on file    Active member of club or organization: Not on file    Attends meetings of clubs or organizations: Not on file    Relationship status: Not on file  Other Topics Concern  . Not on file  Social History Narrative  . Not on file     Family History: The patient's family history includes CVA in his father and mother; Cancer in his father; Diabetes Mellitus II in his sister; Hypertension in his father; Mental illness in his brother.  ROS:   Please see the history of present illness.    ROS  All other systems reviewed and negative.   EKGs/Labs/Other Studies Reviewed:    The following studies were reviewed today: PAP download  EKG:  EKG is not ordered today.  Recent Labs: 11/21/2017: BUN 13; Creatinine, Ser 1.07; Hemoglobin 14.4; Platelets 196; Potassium 3.6; Sodium 139 02/18/2018: ALT 38   Recent Lipid Panel    Component Value Date/Time   CHOL 107 02/18/2018 0747   TRIG 151 (H) 02/18/2018 0747   HDL 37 (L) 02/18/2018 0747   CHOLHDL 2.9 02/18/2018 0747   CHOLHDL 4.4 11/21/2017 0941   VLDL 50 (H) 11/21/2017 0941   LDLCALC 40 02/18/2018 0747    Physical Exam:    VS:  BP 132/82   Pulse (!) 101   Ht 5\' 9"  (1.753 m)   Wt 275 lb 3.2 oz (124.8 kg)   SpO2 95%   BMI 40.64 kg/m     Wt Readings from Last 3 Encounters:  03/11/18 275 lb 3.2 oz (124.8 kg)  11/22/17 269 lb 12.8 oz (122.4 kg)  03/19/15 278 lb 12.8 oz (126.5 kg)     GEN:  Well nourished, well developed in no acute distress HEENT: Normal NECK: No JVD; No carotid bruits LYMPHATICS: No lymphadenopathy CARDIAC: RRR, no murmurs, rubs, gallops RESPIRATORY:  Clear to auscultation without rales, wheezing or rhonchi  ABDOMEN: Soft, non-tender, non-distended MUSCULOSKELETAL:  No edema; No deformity  SKIN: Warm and dry NEUROLOGIC:  Alert and oriented x 3 PSYCHIATRIC:  Normal affect   ASSESSMENT:    1. Coronary artery disease involving native  coronary artery of native heart without angina pectoris   2. OSA (obstructive sleep apnea)   3. Hyperlipidemia LDL goal <70    PLAN:    In order of problems listed above:  1.  ASCAD -status post NSTEMI with cath  showing severe single-vessel CAD with 95% proximal LAD lesion involving the takeoff of the diagonal with 30% ostial stenosis in the diagonal as well as an occluded LAD distally.  He underwent successful stenting of the proximal LAD and PTCA of the distal LAD with some improvement in flow.  There was akinesis of the true apex on echo.  He has not had any anginal symptoms.  He will continue on aspirin 81 mg, Brilinta 90 mg twice daily, high-dose statin.  I will repeat an echo to see if he has had any improvement apical myocardial function.  Of note he had been on carvedilol 3.125 mg twice daily when he was discharged but there were no refills and he ran out of it and never called to see if he was to continue.  We will reorder carvedilol 3.125 mg twice daily.  2. OSA - the patient is tolerating PAP therapy well without any problems. The PAP download was reviewed today and showed an AHI of 0.7/hr on 12 cm H2O with 98% compliance in using more than 4 hours nightly.  The patient has been using and benefiting from PAP use and will continue to benefit from therapy.   3.  Hyperlipidema -LDL goal is less than 70.  His LDL was 40 on 02/18/2018.  ALT was normal.  He will continue on atorvastatin 80 mg daily.   Medication Adjustments/Labs and Tests Ordered: Current medicines are reviewed at length with the patient today.  Concerns regarding medicines are outlined above.  No orders of the defined types were placed in this encounter.  No orders of the defined types were placed in this encounter.   Signed, Armanda Magic, MD  03/11/2018 8:34 AM    [facility]

## 2018-03-12 ENCOUNTER — Ambulatory Visit (HOSPITAL_COMMUNITY): Payer: 59 | Attending: Cardiovascular Disease

## 2018-03-12 ENCOUNTER — Other Ambulatory Visit: Payer: Self-pay

## 2018-03-12 DIAGNOSIS — I2510 Atherosclerotic heart disease of native coronary artery without angina pectoris: Secondary | ICD-10-CM | POA: Insufficient documentation

## 2018-03-18 ENCOUNTER — Encounter: Payer: Self-pay | Admitting: Cardiology

## 2018-03-18 NOTE — Telephone Encounter (Signed)
New Message          Patient returning your call, would like a call back.

## 2018-03-18 NOTE — Telephone Encounter (Signed)
Patient info in result note

## 2018-05-27 ENCOUNTER — Other Ambulatory Visit (HOSPITAL_COMMUNITY): Payer: Self-pay | Admitting: Cardiology

## 2018-08-09 ENCOUNTER — Other Ambulatory Visit: Payer: Self-pay | Admitting: Cardiology

## 2018-11-06 ENCOUNTER — Telehealth: Payer: Self-pay

## 2018-11-06 NOTE — Telephone Encounter (Signed)
I called pt to schedule his f/u visit. He stated that he has moved to [place] and has a new Cardiologist there. He wanted me to Thank You for everything you done for him.

## 2019-04-05 ENCOUNTER — Other Ambulatory Visit: Payer: Self-pay | Admitting: Cardiology

## 2019-05-13 ENCOUNTER — Other Ambulatory Visit: Payer: Self-pay | Admitting: Cardiology

## 2019-05-14 MED ORDER — TICAGRELOR 90 MG PO TABS: 90.0000 mg | ORAL_TABLET | Freq: Two times a day (BID) | ORAL | 0 refills | Status: AC

## 2019-06-30 ENCOUNTER — Other Ambulatory Visit: Payer: Self-pay | Admitting: Cardiology

## 2020-02-10 ENCOUNTER — Other Ambulatory Visit: Payer: Self-pay | Admitting: Cardiology

## 2020-07-28 IMAGING — CR DG CHEST 2V
2 series · 2 of 2 positions shown · non-contrast
Comparison: None.

CLINICAL DATA: Central chest pain

EXAM:
CHEST - 2 VIEW

[w chest pa]
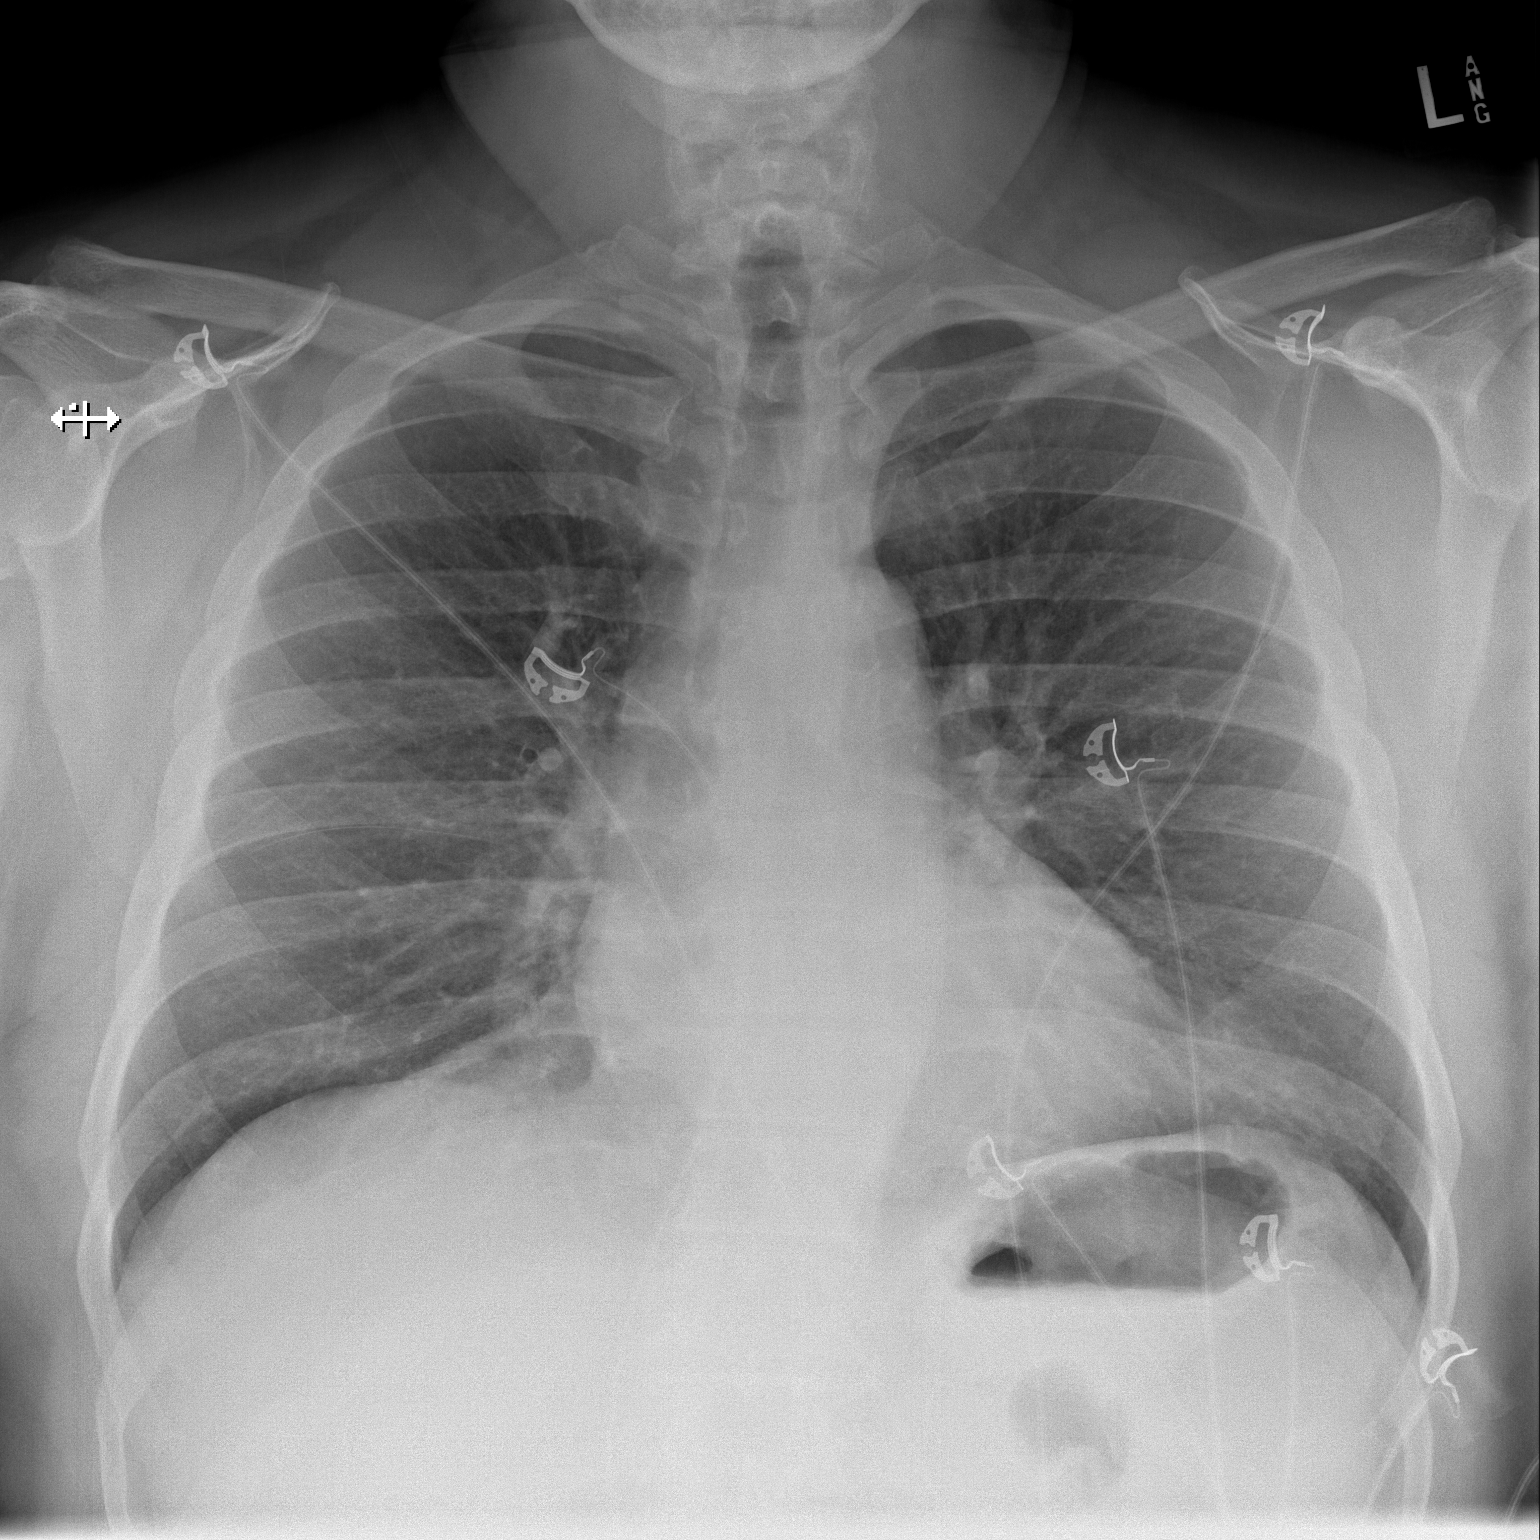

[w chest lat]
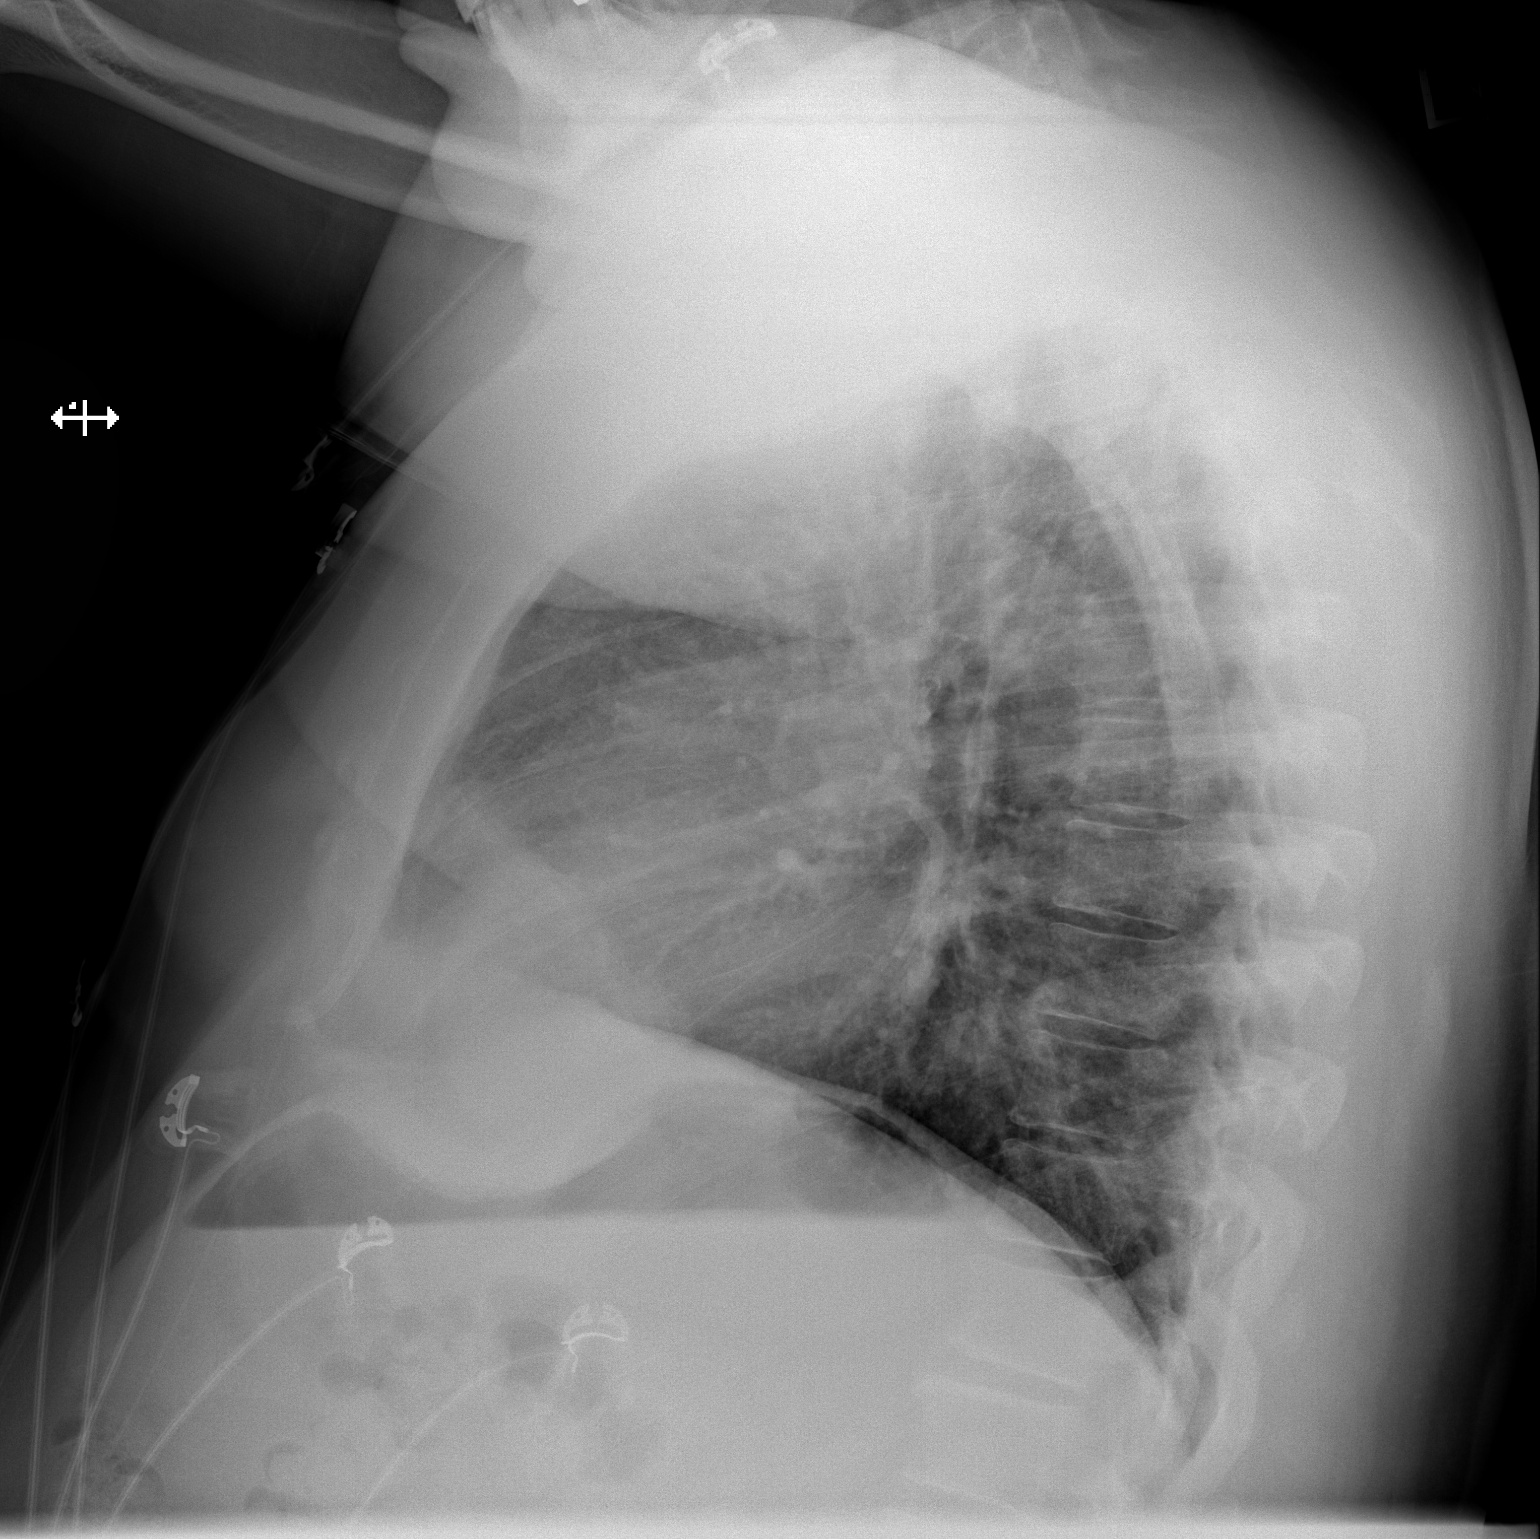

[2 of 2 positions shown; findings below may reference images not displayed]

FINDINGS: Cardiac shadow is within normal limits. The lungs are well aerated
bilaterally with the exception of some mild lingular atelectasis. No
pleural effusion is seen. No bony abnormality is noted.
IMPRESSION: Mild lingular atelectasis.
